# Patient Record
Sex: Male | Born: 1970 | Race: White | Hispanic: No | Marital: Married | Smoking: Current every day smoker
Health system: Southern US, Community
[De-identification: ages and names within clinical notes are randomized; demographics above are authoritative.]

## PROBLEM LIST (undated history)

## (undated) DIAGNOSIS — G4730 Sleep apnea, unspecified: Secondary | ICD-10-CM

## (undated) DIAGNOSIS — F419 Anxiety disorder, unspecified: Secondary | ICD-10-CM

## (undated) DIAGNOSIS — R310 Gross hematuria: Secondary | ICD-10-CM

## (undated) DIAGNOSIS — X58XXXA Exposure to other specified factors, initial encounter: Secondary | ICD-10-CM

## (undated) DIAGNOSIS — S022XXD Fracture of nasal bones, subsequent encounter for fracture with routine healing: Secondary | ICD-10-CM

## (undated) DIAGNOSIS — J342 Deviated nasal septum: Secondary | ICD-10-CM

---

## 1997-11-17 ENCOUNTER — Emergency Department (HOSPITAL_COMMUNITY): Admission: EM | Admit: 1997-11-17 | Discharge: 1997-11-18 | Payer: Self-pay | Admitting: Emergency Medicine

## 2002-12-21 ENCOUNTER — Ambulatory Visit (HOSPITAL_BASED_OUTPATIENT_CLINIC_OR_DEPARTMENT_OTHER): Admission: RE | Admit: 2002-12-21 | Discharge: 2002-12-21 | Payer: Self-pay | Admitting: Otolaryngology

## 2005-05-07 ENCOUNTER — Emergency Department (HOSPITAL_COMMUNITY): Admission: EM | Admit: 2005-05-07 | Discharge: 2005-05-07 | Payer: Self-pay | Admitting: Emergency Medicine

## 2013-11-09 ENCOUNTER — Emergency Department (HOSPITAL_COMMUNITY): Payer: 59

## 2013-11-09 ENCOUNTER — Emergency Department (HOSPITAL_COMMUNITY)
Admission: EM | Admit: 2013-11-09 | Discharge: 2013-11-10 | Disposition: A | Discharge status: Home / Self Care | Payer: 59 | Attending: Emergency Medicine | Admitting: Emergency Medicine

## 2013-11-09 ENCOUNTER — Encounter (HOSPITAL_COMMUNITY): Payer: Self-pay | Admitting: Emergency Medicine

## 2013-11-09 DIAGNOSIS — Z79899 Other long term (current) drug therapy: Secondary | ICD-10-CM | POA: Insufficient documentation

## 2013-11-09 DIAGNOSIS — F17200 Nicotine dependence, unspecified, uncomplicated: Secondary | ICD-10-CM | POA: Insufficient documentation

## 2013-11-09 DIAGNOSIS — R319 Hematuria, unspecified: Secondary | ICD-10-CM

## 2013-11-09 LAB — URINALYSIS, ROUTINE W REFLEX MICROSCOPIC
Glucose, UA: NEGATIVE mg/dL
Ketones, ur: NEGATIVE mg/dL
NITRITE: NEGATIVE
Protein, ur: 100 mg/dL — AB
Specific Gravity, Urine: 1.027 (ref 1.005–1.030)
UROBILINOGEN UA: 1 mg/dL (ref 0.0–1.0)
pH: 6 (ref 5.0–8.0)

## 2013-11-09 LAB — CBC WITH DIFFERENTIAL/PLATELET
BASOS PCT: 0 % (ref 0–1)
Basophils Absolute: 0 10*3/uL (ref 0.0–0.1)
EOS ABS: 0.2 10*3/uL (ref 0.0–0.7)
EOS PCT: 2 % (ref 0–5)
HCT: 39 % (ref 39.0–52.0)
HEMOGLOBIN: 13.2 g/dL (ref 13.0–17.0)
Lymphocytes Relative: 25 % (ref 12–46)
Lymphs Abs: 2.1 10*3/uL (ref 0.7–4.0)
MCH: 30.2 pg (ref 26.0–34.0)
MCHC: 33.8 g/dL (ref 30.0–36.0)
MCV: 89.2 fL (ref 78.0–100.0)
MONOS PCT: 4 % (ref 3–12)
Monocytes Absolute: 0.4 10*3/uL (ref 0.1–1.0)
NEUTROS PCT: 69 % (ref 43–77)
Neutro Abs: 5.9 10*3/uL (ref 1.7–7.7)
PLATELETS: 169 10*3/uL (ref 150–400)
RBC: 4.37 MIL/uL (ref 4.22–5.81)
RDW: 12.5 % (ref 11.5–15.5)
WBC: 8.6 10*3/uL (ref 4.0–10.5)

## 2013-11-09 LAB — URINE MICROSCOPIC-ADD ON
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
## 2013-11-10 ENCOUNTER — Encounter (HOSPITAL_COMMUNITY): Payer: Self-pay | Admitting: Radiology

## 2013-11-10 LAB — COMPREHENSIVE METABOLIC PANEL
ALBUMIN: 4.4 g/dL (ref 3.5–5.2)
ALK PHOS: 65 U/L (ref 39–117)
ALT: 12 U/L (ref 0–53)
AST: 14 U/L (ref 0–37)
BUN: 15 mg/dL (ref 6–23)
CO2: 29 mEq/L (ref 19–32)
Calcium: 9.1 mg/dL (ref 8.4–10.5)
Chloride: 99 mEq/L (ref 96–112)
Creatinine, Ser: 0.78 mg/dL (ref 0.50–1.35)
GFR calc Af Amer: >90 mL/min (ref >90)
GFR calc non Af Amer: >90 mL/min (ref >90)
Glucose, Bld: 119 mg/dL — ABNORMAL HIGH (ref 70–99)
POTASSIUM: 4.7 meq/L (ref 3.7–5.3)
Sodium: 138 mEq/L (ref 137–147)
TOTAL PROTEIN: 7 g/dL (ref 6.0–8.3)
Total Bilirubin: 0.5 mg/dL (ref 0.3–1.2)

## 2013-11-10 LAB — CK: Total CK: 111 U/L (ref 7–232)

## 2013-11-10 MED ORDER — IOHEXOL 300 MG/ML  SOLN
100.0000 mL | Freq: Once | INTRAMUSCULAR | Status: AC | PRN
  Administered 2013-11-10: 100 mL via INTRAVENOUS

## 2013-11-10 MED ORDER — SODIUM CHLORIDE 0.9 % IV BOLUS (SEPSIS)
1000.0000 mL | Freq: Once | INTRAVENOUS | Status: AC
  Administered 2013-11-10: 1000 mL via INTRAVENOUS

## 2013-11-10 NOTE — Discharge Instructions (Signed)
Please follow up with [facility] as soon as possible regarding the large volume of blood in your urine. It is essential that the specialists evaluate you. Please read the information below for more information and for reasons to seek immediate care.  Hematuria, Adult Hematuria is blood in your urine. It can be caused by a bladder infection, kidney infection, prostate infection, kidney stone, or cancer of your urinary tract. Infections can usually be treated with medicine, and a kidney stone usually will pass through your urine. If neither of these is the cause of your hematuria, further workup to find out the reason may be needed. It is very important that you tell your health care provider about any blood you see in your urine, even if the blood stops without treatment or happens without causing pain. Blood in your urine that happens and then stops and then happens again can be a symptom of a very serious condition. Also, pain is not a symptom in the initial stages of many urinary cancers. HOME CARE INSTRUCTIONS   Drink lots of fluid, 3-4 quarts a day. If you have been diagnosed with an infection, cranberry juice is especially recommended, in addition to large amounts of water.  Avoid caffeine, tea, and carbonated beverages, because they tend to irritate the bladder.  Avoid alcohol because it may irritate the prostate.  Only take over-the-counter or prescription medicines for pain, discomfort, or fever as directed by your health care provider.  If you have been diagnosed with a kidney stone, follow your health care provider's instructions regarding straining your urine to catch the stone.  Empty your bladder often. Avoid holding urine for long periods of time.  After a bowel movement, women should cleanse front to back. Use each tissue only once.  Empty your bladder before and after sexual intercourse if you are a male. SEEK MEDICAL CARE IF: You develop back pain, fever, a feeling of  sickness in your stomach (nausea), or vomiting or if your symptoms are not better in 3 days. Return sooner if you are getting worse. SEEK IMMEDIATE MEDICAL CARE IF:   You have a persistent fever, with a temperature of 101.61F (38.8C) or greater.  You develop severe vomiting and are unable to keep the medicine down.  You develop severe back or abdominal pain despite taking your medicines.  You begin passing a large amount of blood or clots in your urine.  You feel extremely weak or faint, or you pass out. MAKE SURE YOU:   Understand these instructions.  Will watch your condition.  Will get help right away if you are not doing well or get worse. Document Released: 05/05/2005 Document Revised: 02/23/2013 Document Reviewed: 01/03/2013 [facility] Patient Information 2015 [place], [place]. This information is not intended to replace advice given to you by your health care provider. Make sure you discuss any questions you have with your health care provider.

## 2013-11-10 NOTE — ED Notes (Signed)
Patient transported to CT 
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
## 2013-11-25 ENCOUNTER — Other Ambulatory Visit: Payer: Self-pay | Admitting: Urology

## 2013-12-01 ENCOUNTER — Encounter (HOSPITAL_BASED_OUTPATIENT_CLINIC_OR_DEPARTMENT_OTHER): Payer: Self-pay | Admitting: *Deleted

## 2013-12-09 ENCOUNTER — Encounter (HOSPITAL_BASED_OUTPATIENT_CLINIC_OR_DEPARTMENT_OTHER): Payer: Self-pay

## 2013-12-09 ENCOUNTER — Encounter (HOSPITAL_BASED_OUTPATIENT_CLINIC_OR_DEPARTMENT_OTHER): Payer: 59 | Admitting: Anesthesiology

## 2013-12-09 ENCOUNTER — Ambulatory Visit (HOSPITAL_BASED_OUTPATIENT_CLINIC_OR_DEPARTMENT_OTHER): Payer: 59 | Admitting: Anesthesiology

## 2013-12-09 ENCOUNTER — Ambulatory Visit (HOSPITAL_BASED_OUTPATIENT_CLINIC_OR_DEPARTMENT_OTHER)
Admission: RE | Admit: 2013-12-09 | Discharge: 2013-12-09 | Disposition: A | Discharge status: Home / Self Care | Payer: 59 | Source: Ambulatory Visit | Attending: Urology | Admitting: Urology

## 2013-12-09 ENCOUNTER — Encounter (HOSPITAL_BASED_OUTPATIENT_CLINIC_OR_DEPARTMENT_OTHER)
Admission: RE | Disposition: A | Discharge status: Home / Self Care | Payer: Self-pay | Source: Ambulatory Visit | Attending: Urology

## 2013-12-09 DIAGNOSIS — Z538 Procedure and treatment not carried out for other reasons: Secondary | ICD-10-CM | POA: Diagnosis not present

## 2013-12-09 DIAGNOSIS — R310 Gross hematuria: Secondary | ICD-10-CM | POA: Insufficient documentation

## 2013-12-09 HISTORY — DX: Sleep apnea, unspecified: G47.30

## 2013-12-09 HISTORY — DX: Exposure to other specified factors, initial encounter: X58.XXXA

## 2013-12-09 HISTORY — DX: Gross hematuria: R31.0

## 2013-12-09 HISTORY — DX: Anxiety disorder, unspecified: F41.9

## 2013-12-09 SURGERY — CYSTOURETEROSCOPY, WITH RETROGRADE PYELOGRAM AND STENT INSERTION
Anesthesia: General | Laterality: Left

## 2013-12-09 MED ORDER — MIDAZOLAM HCL 2 MG/2ML IJ SOLN
INTRAMUSCULAR | Status: AC
  Filled 2013-12-09: qty 2

## 2013-12-09 MED ORDER — LACTATED RINGERS IV SOLN
INTRAVENOUS | Status: DC
  Filled 2013-12-09: qty 1000

## 2013-12-09 MED ORDER — CEFAZOLIN SODIUM-DEXTROSE 2-3 GM-% IV SOLR
2.0000 g | INTRAVENOUS | Status: DC
  Filled 2013-12-09: qty 50

## 2013-12-09 MED ORDER — FENTANYL CITRATE 0.05 MG/ML IJ SOLN
INTRAMUSCULAR | Status: AC
  Filled 2013-12-09: qty 4

## 2013-12-09 SURGICAL SUPPLY — 33 items
ADAPTER CATH URET PLST 4-6FR (CATHETERS) IMPLANT
BAG DRAIN URO-CYSTO SKYTR STRL (DRAIN) IMPLANT
BASKET LASER NITINOL 1.9FR (BASKET) IMPLANT
BASKET STNLS GEMINI 4WIRE 3FR (BASKET) IMPLANT
BASKET ZERO TIP NITINOL 2.4FR (BASKET) IMPLANT
CANISTER SUCT LVC 12 LTR MEDI- (MISCELLANEOUS) IMPLANT
CATH INTERMIT  6FR 70CM (CATHETERS) IMPLANT
CATH URET 5FR 28IN CONE TIP (BALLOONS)
CATH URET 5FR 28IN OPEN ENDED (CATHETERS) IMPLANT
CATH URET 5FR 70CM CONE TIP (BALLOONS) IMPLANT
CLOTH BEACON ORANGE TIMEOUT ST (SAFETY) IMPLANT
DRAPE CAMERA CLOSED 9X96 (DRAPES) IMPLANT
ELECT REM PT RETURN 9FT ADLT (ELECTROSURGICAL)
ELECTRODE REM PT RTRN 9FT ADLT (ELECTROSURGICAL) IMPLANT
FIBER LASER FLEXIVA 200 (UROLOGICAL SUPPLIES) IMPLANT
FIBER LASER FLEXIVA 365 (UROLOGICAL SUPPLIES) IMPLANT
GLOVE BIO SURGEON STRL SZ7.5 (GLOVE) IMPLANT
GOWN PREVENTION PLUS LG XLONG (DISPOSABLE) IMPLANT
GOWN STRL REIN XL XLG (GOWN DISPOSABLE) IMPLANT
GUIDEWIRE 0.038 PTFE COATED (WIRE) IMPLANT
GUIDEWIRE ANG ZIPWIRE 038X150 (WIRE) IMPLANT
GUIDEWIRE STR DUAL SENSOR (WIRE) IMPLANT
IV NS IRRIG 3000ML ARTHROMATIC (IV SOLUTION) IMPLANT
KIT BALLIN UROMAX 15FX10 (LABEL) IMPLANT
KIT BALLN UROMAX 15FX4 (MISCELLANEOUS) IMPLANT
KIT BALLN UROMAX 26 75X4 (MISCELLANEOUS)
PACK CYSTOSCOPY (CUSTOM PROCEDURE TRAY) IMPLANT
SET HIGH PRES BAL DIL (LABEL)
SHEATH ACCESS URETERAL 38CM (SHEATH) IMPLANT
SHEATH ACCESS URETERAL 54CM (SHEATH) IMPLANT
SHEATH URET ACCESS 12FR/35CM (UROLOGICAL SUPPLIES) IMPLANT
SHEATH URET ACCESS 12FR/55CM (UROLOGICAL SUPPLIES) IMPLANT
SYRINGE IRR TOOMEY STRL 70CC (SYRINGE) IMPLANT

## 2013-12-09 NOTE — H&P (Signed)
  Patient needs diagnostic cystoscopy, retrograde pyelograms and possible ureteroscopy. This will be rescheduled when the [facility] available.

## 2013-12-09 NOTE — Progress Notes (Signed)
Case cancelled by Dr. Mena GoesEskridge due to his desire to use a specific type of scope that is being trialed and the reps are not here today.

## 2013-12-09 NOTE — Progress Notes (Signed)
The patient reports this morning his gross hematuria is clearing. Urine now light pink. He continues to be adamant that he wants to get the most information at of one procedure and wants to limit his risk of a staged procedure. I share his concerns and fortunately we are trialing Wolf equipment and have needlepoint semirigid ureteroscope as well as a small caliber high-resolution digital ureteroscope available. I believe this equipment would greatly enhance our diagnostic ability as well as our chances of gaining upper tract access and limiting staged procedure. I told the patient on a recent 70 inspection of the bladder the current scopes are dark and our diagnostic ability is severely compromised. I told him earlier in the week we had planned to continue surveillance, but with recurrence and gross hematuria I do recommend endoscopic evaluation. I told him [facility] would [facility] and line them up for his procedure. I would be willing to post-pone the procedure to later today and rearrange my schedule. I then scrubbed in my first case. The patient was informed [facility] not available today. The patient was understandably frustrated and left before I could discuss with him proceeding today with the old equipment. He told the nurses I was "wishy washy". Unfortunately, he has a significant problem without a clear diagnosis right now and his case takes some careful consideration and planning.

## 2013-12-09 NOTE — Anesthesia Preprocedure Evaluation (Deleted)
Anesthesia Evaluation  Patient identified by MRN, date of birth, ID band Patient awake    Reviewed: Allergy & Precautions, H&P , NPO status , Patient's Chart, lab work & pertinent test results  Airway Mallampati: II TM Distance: >3 FB Neck ROM: Full    Dental no notable dental hx.    Pulmonary Current Smoker,  breath sounds clear to auscultation  Pulmonary exam normal       Cardiovascular negative cardio ROS  Rhythm:Regular Rate:Normal     Neuro/Psych negative neurological ROS  negative psych ROS   GI/Hepatic negative GI ROS, (+)     substance abuse  , Methadone use   Endo/Other  negative endocrine ROS  Renal/GU negative Renal ROS  negative genitourinary   Musculoskeletal negative musculoskeletal ROS (+)   Abdominal   Peds negative pediatric ROS (+)  Hematology negative hematology ROS (+)   Anesthesia Other Findings   Reproductive/Obstetrics negative OB ROS                           Anesthesia Physical Anesthesia Plan  ASA: II  Anesthesia Plan: General   Post-op Pain Management:    Induction: Intravenous  Airway Management Planned: LMA  Additional Equipment:   Intra-op Plan:   Post-operative Plan: Extubation in OR  Informed Consent: I have reviewed the patients History and Physical, chart, labs and discussed the procedure including the risks, benefits and alternatives for the proposed anesthesia with the patient or authorized representative who has indicated his/her understanding and acceptance.   Dental advisory given  Plan Discussed with: CRNA and Surgeon  Anesthesia Plan Comments:         Anesthesia Quick Evaluation

## 2014-06-01 ENCOUNTER — Encounter (HOSPITAL_BASED_OUTPATIENT_CLINIC_OR_DEPARTMENT_OTHER): Payer: Self-pay | Admitting: Urology

## 2015-06-20 ENCOUNTER — Inpatient Hospital Stay (HOSPITAL_COMMUNITY)
Admission: EM | Admit: 2015-06-20 | Discharge: 2015-06-24 | DRG: 880 | Disposition: A | Discharge status: Home / Self Care | Payer: Self-pay | Attending: Internal Medicine | Admitting: Internal Medicine

## 2015-06-20 ENCOUNTER — Encounter (HOSPITAL_COMMUNITY): Payer: Self-pay | Admitting: Emergency Medicine

## 2015-06-20 DIAGNOSIS — D649 Anemia, unspecified: Secondary | ICD-10-CM | POA: Diagnosis present

## 2015-06-20 DIAGNOSIS — R7989 Other specified abnormal findings of blood chemistry: Secondary | ICD-10-CM | POA: Diagnosis present

## 2015-06-20 DIAGNOSIS — E46 Unspecified protein-calorie malnutrition: Secondary | ICD-10-CM | POA: Diagnosis present

## 2015-06-20 DIAGNOSIS — R45851 Suicidal ideations: Principal | ICD-10-CM | POA: Diagnosis present

## 2015-06-20 DIAGNOSIS — F1120 Opioid dependence, uncomplicated: Secondary | ICD-10-CM | POA: Diagnosis present

## 2015-06-20 DIAGNOSIS — F419 Anxiety disorder, unspecified: Secondary | ICD-10-CM | POA: Diagnosis present

## 2015-06-20 DIAGNOSIS — Z79899 Other long term (current) drug therapy: Secondary | ICD-10-CM

## 2015-06-20 DIAGNOSIS — R634 Abnormal weight loss: Secondary | ICD-10-CM

## 2015-06-20 DIAGNOSIS — F909 Attention-deficit hyperactivity disorder, unspecified type: Secondary | ICD-10-CM | POA: Diagnosis present

## 2015-06-20 DIAGNOSIS — F1510 Other stimulant abuse, uncomplicated: Secondary | ICD-10-CM | POA: Diagnosis present

## 2015-06-20 DIAGNOSIS — F1410 Cocaine abuse, uncomplicated: Secondary | ICD-10-CM | POA: Diagnosis present

## 2015-06-20 DIAGNOSIS — R3129 Other microscopic hematuria: Secondary | ICD-10-CM | POA: Diagnosis present

## 2015-06-20 DIAGNOSIS — G4730 Sleep apnea, unspecified: Secondary | ICD-10-CM | POA: Diagnosis present

## 2015-06-20 DIAGNOSIS — F322 Major depressive disorder, single episode, severe without psychotic features: Secondary | ICD-10-CM | POA: Diagnosis present

## 2015-06-20 DIAGNOSIS — F17210 Nicotine dependence, cigarettes, uncomplicated: Secondary | ICD-10-CM | POA: Diagnosis present

## 2015-06-20 DIAGNOSIS — T50901A Poisoning by unspecified drugs, medicaments and biological substances, accidental (unintentional), initial encounter: Secondary | ICD-10-CM | POA: Diagnosis present

## 2015-06-20 DIAGNOSIS — F1010 Alcohol abuse, uncomplicated: Secondary | ICD-10-CM | POA: Diagnosis present

## 2015-06-20 DIAGNOSIS — R319 Hematuria, unspecified: Secondary | ICD-10-CM

## 2015-06-20 DIAGNOSIS — R64 Cachexia: Secondary | ICD-10-CM | POA: Diagnosis present

## 2015-06-20 DIAGNOSIS — D509 Iron deficiency anemia, unspecified: Secondary | ICD-10-CM | POA: Diagnosis present

## 2015-06-20 DIAGNOSIS — T50911A Poisoning by multiple unspecified drugs, medicaments and biological substances, accidental (unintentional), initial encounter: Secondary | ICD-10-CM | POA: Diagnosis present

## 2015-06-20 DIAGNOSIS — F1110 Opioid abuse, uncomplicated: Secondary | ICD-10-CM

## 2015-06-20 DIAGNOSIS — F1123 Opioid dependence with withdrawal: Secondary | ICD-10-CM | POA: Diagnosis present

## 2015-06-20 LAB — CBC WITH DIFFERENTIAL/PLATELET
BASOS ABS: 0 10*3/uL (ref 0.0–0.1)
Basophils Relative: 0 %
Eosinophils Absolute: 0.2 10*3/uL (ref 0.0–0.7)
Eosinophils Relative: 3 %
HEMATOCRIT: 26.2 % — AB (ref 39.0–52.0)
Hemoglobin: 7.3 g/dL — ABNORMAL LOW (ref 13.0–17.0)
LYMPHS PCT: 28 %
Lymphs Abs: 1.6 10*3/uL (ref 0.7–4.0)
MCH: 20.6 pg — ABNORMAL LOW (ref 26.0–34.0)
MCHC: 27.9 g/dL — ABNORMAL LOW (ref 30.0–36.0)
MCV: 73.8 fL — ABNORMAL LOW (ref 78.0–100.0)
MONOS PCT: 7 %
Monocytes Absolute: 0.4 10*3/uL (ref 0.1–1.0)
Neutro Abs: 3.5 10*3/uL (ref 1.7–7.7)
Neutrophils Relative %: 62 %
Platelets: 203 10*3/uL (ref 150–400)
RBC: 3.55 MIL/uL — AB (ref 4.22–5.81)
RDW: 16 % — ABNORMAL HIGH (ref 11.5–15.5)
WBC: 5.7 10*3/uL (ref 4.0–10.5)

## 2015-06-20 LAB — COMPREHENSIVE METABOLIC PANEL
ALT: 17 U/L (ref 17–63)
AST: 20 U/L (ref 15–41)
Albumin: 3.9 g/dL (ref 3.5–5.0)
Alkaline Phosphatase: 66 U/L (ref 38–126)
Anion gap: 8 (ref 5–15)
BUN: 18 mg/dL (ref 6–20)
CHLORIDE: 100 mmol/L — AB (ref 101–111)
CO2: 28 mmol/L (ref 22–32)
Calcium: 8.5 mg/dL — ABNORMAL LOW (ref 8.9–10.3)
Creatinine, Ser: 0.73 mg/dL (ref 0.61–1.24)
GFR calc non Af Amer: >60 mL/min (ref >60)
Glucose, Bld: 92 mg/dL (ref 65–99)
Potassium: 3.9 mmol/L (ref 3.5–5.1)
SODIUM: 136 mmol/L (ref 135–145)
Total Bilirubin: 0.4 mg/dL (ref 0.3–1.2)
Total Protein: 6.5 g/dL (ref 6.5–8.1)

## 2015-06-20 LAB — ACETAMINOPHEN LEVEL

## 2015-06-20 LAB — ETHANOL

## 2015-06-20 LAB — SALICYLATE LEVEL: Salicylate Lvl: <4 mg/dL (ref 2.8–30.0)

## 2015-06-20 MED ORDER — CLONAZEPAM 1 MG PO TABS
2.0000 mg | ORAL_TABLET | Freq: Three times a day (TID) | ORAL | Status: DC | PRN
  Administered 2015-06-21 (×2): 2 mg via ORAL
  Filled 2015-06-20 (×2): qty 2

## 2015-06-20 NOTE — BH Assessment (Signed)
Consulted with Donell Sievert, PA-C who recommends observation overnight and evaluation but psychiatry in the morning to uphold or rescind the IVC. Informed EDP of disposition.   Davina Poke, LCSW Therapeutic Triage Specialist [facility] 06/20/2015 9:07 PM
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
## 2015-06-20 NOTE — BH Assessment (Signed)
Contacted patients brother Reece Leader, who is the petitioner, at [phone] to obtain collateral information on what led to him petitioning the patient to an IVC. There was no answer and no identifying information on the voicemail. Left voicemail requesting he return call to [facility] at [phone].   Davina Poke, LCSW Therapeutic Triage Specialist [facility] 06/20/2015 8:49 PM

## 2015-06-20 NOTE — ED Notes (Signed)
Pt reminded of need for urine sample.  

## 2015-06-20 NOTE — BHH Counselor (Signed)
Requested UDS from patients nurse.  Davina Poke, LCSW Therapeutic Triage Specialist [facility] 06/20/2015 10:07 PM

## 2015-06-20 NOTE — ED Notes (Signed)
Bed: [facility] Expected date: 06/20/15 Expected time:  Means of arrival:  Comments: Hold for [employer]

## 2015-06-20 NOTE — ED Notes (Addendum)
Pt here from home with [employer]. He is being IVC'ed for suicidal thoughts. Pt's brother is here from [place] and IVCed him tonight for reporting SI. Pt is alert and coopertive. Pt denies any suicidal thoughts. He states he has been under stress related to his recent divorce, but he thinks that "suicide is the greatest sin one can commit".
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
## 2015-06-21 ENCOUNTER — Inpatient Hospital Stay (HOSPITAL_COMMUNITY): Payer: Self-pay

## 2015-06-21 ENCOUNTER — Encounter (HOSPITAL_COMMUNITY): Payer: Self-pay | Admitting: Radiology

## 2015-06-21 DIAGNOSIS — D500 Iron deficiency anemia secondary to blood loss (chronic): Secondary | ICD-10-CM

## 2015-06-21 DIAGNOSIS — F1120 Opioid dependence, uncomplicated: Secondary | ICD-10-CM | POA: Diagnosis present

## 2015-06-21 DIAGNOSIS — D509 Iron deficiency anemia, unspecified: Secondary | ICD-10-CM | POA: Diagnosis present

## 2015-06-21 DIAGNOSIS — D649 Anemia, unspecified: Secondary | ICD-10-CM | POA: Diagnosis present

## 2015-06-21 DIAGNOSIS — T50911A Poisoning by multiple unspecified drugs, medicaments and biological substances, accidental (unintentional), initial encounter: Secondary | ICD-10-CM | POA: Diagnosis present

## 2015-06-21 DIAGNOSIS — F1110 Opioid abuse, uncomplicated: Secondary | ICD-10-CM | POA: Insufficient documentation

## 2015-06-21 DIAGNOSIS — F322 Major depressive disorder, single episode, severe without psychotic features: Secondary | ICD-10-CM | POA: Diagnosis present

## 2015-06-21 LAB — URINALYSIS, ROUTINE W REFLEX MICROSCOPIC
Bilirubin Urine: NEGATIVE
GLUCOSE, UA: NEGATIVE mg/dL
Ketones, ur: NEGATIVE mg/dL
LEUKOCYTES UA: NEGATIVE
Nitrite: NEGATIVE
PH: 8 (ref 5.0–8.0)
Protein, ur: NEGATIVE mg/dL
SPECIFIC GRAVITY, URINE: 1.009 (ref 1.005–1.030)

## 2015-06-21 LAB — VITAMIN B12: VITAMIN B 12: 249 pg/mL (ref 180–914)

## 2015-06-21 LAB — CBC WITH DIFFERENTIAL/PLATELET
Basophils Absolute: 0 10*3/uL (ref 0.0–0.1)
Basophils Relative: 0 %
EOS PCT: 3 %
Eosinophils Absolute: 0.1 10*3/uL (ref 0.0–0.7)
HEMATOCRIT: 25.6 % — AB (ref 39.0–52.0)
HEMOGLOBIN: 7.1 g/dL — AB (ref 13.0–17.0)
LYMPHS ABS: 0.7 10*3/uL (ref 0.7–4.0)
LYMPHS PCT: 18 %
MCH: 19.8 pg — ABNORMAL LOW (ref 26.0–34.0)
MCHC: 27.7 g/dL — ABNORMAL LOW (ref 30.0–36.0)
MCV: 71.5 fL — AB (ref 78.0–100.0)
MONOS PCT: 7 %
Monocytes Absolute: 0.3 10*3/uL (ref 0.1–1.0)
Neutro Abs: 2.7 10*3/uL (ref 1.7–7.7)
Neutrophils Relative %: 72 %
Platelets: 220 10*3/uL (ref 150–400)
RBC: 3.58 MIL/uL — AB (ref 4.22–5.81)
RDW: 16 % — AB (ref 11.5–15.5)
WBC: 3.8 10*3/uL — AB (ref 4.0–10.5)

## 2015-06-21 LAB — IRON AND TIBC
IRON: 10 ug/dL — AB (ref 45–182)
Iron: 14 ug/dL — ABNORMAL LOW (ref 45–182)
SATURATION RATIOS: 2 % — AB (ref 17.9–39.5)
SATURATION RATIOS: 3 % — AB (ref 17.9–39.5)
TIBC: 410 ug/dL (ref 250–450)
TIBC: 409 ug/dL (ref 250–450)
UIBC: 400 ug/dL
UIBC: 395 ug/dL

## 2015-06-21 LAB — FERRITIN
FERRITIN: 3 ng/mL — AB (ref 24–336)
Ferritin: 3 ng/mL — ABNORMAL LOW (ref 24–336)

## 2015-06-21 LAB — POC OCCULT BLOOD, ED: Fecal Occult Bld: NEGATIVE

## 2015-06-21 LAB — URINE MICROSCOPIC-ADD ON
BACTERIA UA: NONE SEEN
WBC, UA: NONE SEEN WBC/hpf (ref 0–5)

## 2015-06-21 LAB — RAPID URINE DRUG SCREEN, HOSP PERFORMED
AMPHETAMINES: POSITIVE — AB
Barbiturates: NOT DETECTED
Benzodiazepines: NOT DETECTED
COCAINE: POSITIVE — AB
Opiates: POSITIVE — AB
TETRAHYDROCANNABINOL: NOT DETECTED

## 2015-06-21 LAB — TSH: TSH: 0.294 u[IU]/mL — ABNORMAL LOW (ref 0.350–4.500)

## 2015-06-21 LAB — MAGNESIUM: Magnesium: 1.9 mg/dL (ref 1.7–2.4)

## 2015-06-21 LAB — PREPARE RBC (CROSSMATCH)

## 2015-06-21 LAB — TROPONIN I

## 2015-06-21 LAB — ABO/RH: ABO/RH(D): A POS

## 2015-06-21 MED ORDER — HYDROXYZINE HCL 25 MG PO TABS
25.0000 mg | ORAL_TABLET | Freq: Four times a day (QID) | ORAL | Status: DC | PRN
  Administered 2015-06-21 – 2015-06-24 (×4): 25 mg via ORAL
  Filled 2015-06-21 (×4): qty 1

## 2015-06-21 MED ORDER — ONDANSETRON 4 MG PO TBDP: 4.0000 mg | ORAL_TABLET | Freq: Four times a day (QID) | ORAL | Status: DC | PRN

## 2015-06-21 MED ORDER — CLONIDINE HCL 0.1 MG PO TABS
0.1000 mg | ORAL_TABLET | Freq: Every day | ORAL | Status: DC
  Filled 2015-06-21: qty 1

## 2015-06-21 MED ORDER — ONDANSETRON HCL 4 MG PO TABS: 4.0000 mg | ORAL_TABLET | Freq: Four times a day (QID) | ORAL | Status: DC | PRN

## 2015-06-21 MED ORDER — LEVALBUTEROL HCL 0.63 MG/3ML IN NEBU: 0.6300 mg | INHALATION_SOLUTION | Freq: Four times a day (QID) | RESPIRATORY_TRACT | Status: DC | PRN

## 2015-06-21 MED ORDER — FOLIC ACID 1 MG PO TABS
1.0000 mg | ORAL_TABLET | Freq: Every day | ORAL | Status: DC
  Administered 2015-06-21 – 2015-06-24 (×3): 1 mg via ORAL
  Filled 2015-06-21 (×4): qty 1

## 2015-06-21 MED ORDER — CLONIDINE HCL 0.1 MG PO TABS
0.1000 mg | ORAL_TABLET | Freq: Four times a day (QID) | ORAL | Status: AC
  Administered 2015-06-21 – 2015-06-22 (×7): 0.1 mg via ORAL
  Filled 2015-06-21 (×7): qty 1

## 2015-06-21 MED ORDER — LORAZEPAM 1 MG PO TABS
1.0000 mg | ORAL_TABLET | Freq: Four times a day (QID) | ORAL | Status: DC | PRN
  Administered 2015-06-22: 1 mg via ORAL
  Filled 2015-06-21: qty 1

## 2015-06-21 MED ORDER — AMPHETAMINE-DEXTROAMPHETAMINE 10 MG PO TABS
10.0000 mg | ORAL_TABLET | Freq: Two times a day (BID) | ORAL | Status: DC
  Administered 2015-06-22 – 2015-06-24 (×5): 10 mg via ORAL
  Filled 2015-06-21 (×5): qty 1

## 2015-06-21 MED ORDER — ACETAMINOPHEN 325 MG PO TABS: 650.0000 mg | ORAL_TABLET | Freq: Four times a day (QID) | ORAL | Status: DC | PRN

## 2015-06-21 MED ORDER — SODIUM CHLORIDE 0.9 % IV SOLN
80.0000 mg | Freq: Once | INTRAVENOUS | Status: AC
  Administered 2015-06-21: 80 mg via INTRAVENOUS
  Filled 2015-06-21: qty 80

## 2015-06-21 MED ORDER — VITAMIN B-1 100 MG PO TABS
100.0000 mg | ORAL_TABLET | Freq: Every day | ORAL | Status: DC
  Administered 2015-06-21 – 2015-06-24 (×3): 100 mg via ORAL
  Filled 2015-06-21 (×4): qty 1

## 2015-06-21 MED ORDER — THIAMINE HCL 100 MG/ML IJ SOLN
Freq: Once | INTRAVENOUS | Status: AC
  Administered 2015-06-21: 14:00:00 via INTRAVENOUS
  Filled 2015-06-21: qty 1000

## 2015-06-21 MED ORDER — DICYCLOMINE HCL 20 MG PO TABS
20.0000 mg | ORAL_TABLET | Freq: Four times a day (QID) | ORAL | Status: DC | PRN
  Filled 2015-06-21: qty 1

## 2015-06-21 MED ORDER — CLONIDINE HCL 0.1 MG PO TABS
0.1000 mg | ORAL_TABLET | ORAL | Status: DC
  Administered 2015-06-23 – 2015-06-24 (×3): 0.1 mg via ORAL
  Filled 2015-06-21 (×4): qty 1

## 2015-06-21 MED ORDER — IOHEXOL 300 MG/ML  SOLN
100.0000 mL | Freq: Once | INTRAMUSCULAR | Status: AC | PRN
  Administered 2015-06-21: 100 mL via INTRAVENOUS

## 2015-06-21 MED ORDER — IOHEXOL 300 MG/ML  SOLN
50.0000 mL | Freq: Once | INTRAMUSCULAR | Status: DC | PRN
  Administered 2015-06-21: 50 mL via ORAL
  Filled 2015-06-21: qty 50

## 2015-06-21 MED ORDER — MIRTAZAPINE 7.5 MG PO TABS
7.5000 mg | ORAL_TABLET | Freq: Every day | ORAL | Status: DC
Start: 2015-06-21 — End: 2015-06-24
  Administered 2015-06-21 – 2015-06-23 (×3): 7.5 mg via ORAL
  Filled 2015-06-21 (×5): qty 1

## 2015-06-21 MED ORDER — METHOCARBAMOL 500 MG PO TABS
500.0000 mg | ORAL_TABLET | Freq: Three times a day (TID) | ORAL | Status: DC | PRN
  Administered 2015-06-21 – 2015-06-23 (×4): 500 mg via ORAL
  Filled 2015-06-21 (×4): qty 1

## 2015-06-21 MED ORDER — PANTOPRAZOLE SODIUM 40 MG IV SOLR: 40.0000 mg | Freq: Two times a day (BID) | INTRAVENOUS | Status: DC

## 2015-06-21 MED ORDER — CLONAZEPAM 1 MG PO TABS
1.0000 mg | ORAL_TABLET | Freq: Once | ORAL | Status: AC
  Administered 2015-06-21: 1 mg via ORAL
  Filled 2015-06-21: qty 1

## 2015-06-21 MED ORDER — ENOXAPARIN SODIUM 40 MG/0.4ML ~~LOC~~ SOLN
40.0000 mg | SUBCUTANEOUS | Status: DC
  Filled 2015-06-21 (×2): qty 0.4

## 2015-06-21 MED ORDER — THIAMINE HCL 100 MG/ML IJ SOLN
100.0000 mg | Freq: Every day | INTRAMUSCULAR | Status: DC
  Filled 2015-06-21 (×3): qty 1

## 2015-06-21 MED ORDER — NAPROXEN 500 MG PO TABS: 500.0000 mg | ORAL_TABLET | Freq: Two times a day (BID) | ORAL | Status: DC | PRN

## 2015-06-21 MED ORDER — LOPERAMIDE HCL 2 MG PO CAPS: 2.0000 mg | ORAL_CAPSULE | ORAL | Status: DC | PRN

## 2015-06-21 MED ORDER — LORAZEPAM 2 MG/ML IJ SOLN
1.0000 mg | Freq: Four times a day (QID) | INTRAMUSCULAR | Status: DC | PRN
  Administered 2015-06-21 – 2015-06-24 (×9): 1 mg via INTRAVENOUS
  Filled 2015-06-21 (×10): qty 1

## 2015-06-21 MED ORDER — PANTOPRAZOLE SODIUM 40 MG IV SOLR
8.0000 mg/h | INTRAVENOUS | Status: DC
  Administered 2015-06-21 – 2015-06-22 (×3): 8 mg/h via INTRAVENOUS
  Filled 2015-06-21 (×4): qty 80

## 2015-06-21 MED ORDER — SODIUM CHLORIDE 0.9% FLUSH
3.0000 mL | Freq: Two times a day (BID) | INTRAVENOUS | Status: DC
  Administered 2015-06-21 – 2015-06-24 (×5): 3 mL via INTRAVENOUS

## 2015-06-21 MED ORDER — SODIUM CHLORIDE 0.9 % IV SOLN
Freq: Once | INTRAVENOUS | Status: AC
  Administered 2015-06-21: 14:00:00 via INTRAVENOUS

## 2015-06-21 MED ORDER — NICOTINE 14 MG/24HR TD PT24
14.0000 mg | MEDICATED_PATCH | Freq: Every day | TRANSDERMAL | Status: DC
  Administered 2015-06-22 – 2015-06-24 (×3): 14 mg via TRANSDERMAL
  Filled 2015-06-21 (×3): qty 1

## 2015-06-21 MED ORDER — GABAPENTIN 100 MG PO CAPS
200.0000 mg | ORAL_CAPSULE | Freq: Three times a day (TID) | ORAL | Status: DC
  Administered 2015-06-21 – 2015-06-24 (×10): 200 mg via ORAL
  Filled 2015-06-21 (×12): qty 2

## 2015-06-21 MED ORDER — ADULT MULTIVITAMIN W/MINERALS CH
1.0000 | ORAL_TABLET | Freq: Every day | ORAL | Status: DC
  Administered 2015-06-21 – 2015-06-24 (×3): 1 via ORAL
  Filled 2015-06-21 (×4): qty 1

## 2015-06-21 MED ORDER — LORAZEPAM 1 MG PO TABS
1.0000 mg | ORAL_TABLET | Freq: Three times a day (TID) | ORAL | Status: DC | PRN
Start: 2015-06-21 — End: 2015-06-24

## 2015-06-21 MED ORDER — ACETAMINOPHEN 650 MG RE SUPP: 650.0000 mg | Freq: Four times a day (QID) | RECTAL | Status: DC | PRN

## 2015-06-21 MED ORDER — ONDANSETRON HCL 4 MG/2ML IJ SOLN: 4.0000 mg | Freq: Four times a day (QID) | INTRAMUSCULAR | Status: DC | PRN
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
## 2015-06-21 NOTE — ED Notes (Signed)
Spoke with EDP about patients Hemoglobin.  Notified ER Charge about need to transfer.

## 2015-06-21 NOTE — ED Notes (Signed)
No channels available for blood transfusion. Portable called. AD Jen notified.

## 2015-06-21 NOTE — Progress Notes (Addendum)
Pt informed CM while she visited that he wanted to file a grievance for being here "because of my brother" "now he is not answering his phone"  "He lives in [place]" He states he spoke with [facility] NP/MD about this earlier.    CM spoke with pt who confirms uninsured [employer] resident with no pcp.  CM discussed and provided written information for uninsured accepting pcps, discussed the importance of pcp vs EDP services for f/u care, www.needymeds.org, www.goodrx.com, discounted pharmacies and other [employer] such as [facility] , [employer], affordable care act, financial assistance, uninsured dental services, [place] med assist, DSS and  health department  Reviewed resources for [employer] uninsured accepting pcps like [facility], family medicine at [facility], [facility], [facility], local urgent care centers, [facility], [facility] family practice, general medical clinics, family services of the [place], [facility] urgent care plus others, medication resources, [facility] out patient pharmacies and housing Pt voiced understanding and appreciation of resources provided   Provided [facility] contact information Pt agreed to a referral Cm completed referral Pt to be contact by [facility] clinical liason   Entered in d/c instructions  Please use the resources provided to you in emergency room by case manager to assist with doctor for follow up Call on 06/25/2015 A referral for you has been sent to [facility] if you have not received a call in 3 days you may contact them Call Scherry Ran at [phone] Tuesday-Friday [email or web address], As needed These [place] uninsured resources provide possible primary care providers, resources for discounted medications, housing, dental resources, affordable care act information, plus other resources for [facility]

## 2015-06-21 NOTE — ED Notes (Signed)
Patient transported to CT 

## 2015-06-21 NOTE — ED Notes (Signed)
Patient denies SI, HI, AVH. Reports frustration, anxiety related to being IVC and hospitalized. Upset that his "adopted brother is able to do this". Appears drowsy.  Patient requested PRN Klonopin.  Encouragement offered. Oriented to the unit.   Q 15 safety checks in place.

## 2015-06-21 NOTE — Progress Notes (Signed)
Pt transferred from ED to room 1507. AO x 4. Pt on suicide and seizure precautions. Pt orientated to the room. Safety sitter at pt bedside. Pt belongings locked up. No questions or concerns from the pt at this moment. Sejal Cofield W Malynn Lucy, RN

## 2015-06-21 NOTE — ED Notes (Signed)
Bed: WA21 Expected date:  Expected time:  Means of arrival:  Comments: Hold for psych ED

## 2015-06-22 DIAGNOSIS — R7989 Other specified abnormal findings of blood chemistry: Secondary | ICD-10-CM | POA: Diagnosis present

## 2015-06-22 DIAGNOSIS — R3129 Other microscopic hematuria: Secondary | ICD-10-CM | POA: Diagnosis present

## 2015-06-22 LAB — COMPREHENSIVE METABOLIC PANEL
ALT: 14 U/L — AB (ref 17–63)
AST: 16 U/L (ref 15–41)
Albumin: 3.4 g/dL — ABNORMAL LOW (ref 3.5–5.0)
Alkaline Phosphatase: 54 U/L (ref 38–126)
Anion gap: 5 (ref 5–15)
BUN: 9 mg/dL (ref 6–20)
CHLORIDE: 111 mmol/L (ref 101–111)
CO2: 26 mmol/L (ref 22–32)
CREATININE: 0.63 mg/dL (ref 0.61–1.24)
Calcium: 8.4 mg/dL — ABNORMAL LOW (ref 8.9–10.3)
Glucose, Bld: 119 mg/dL — ABNORMAL HIGH (ref 65–99)
POTASSIUM: 3.7 mmol/L (ref 3.5–5.1)
SODIUM: 142 mmol/L (ref 135–145)
Total Bilirubin: 0.5 mg/dL (ref 0.3–1.2)
Total Protein: 5.6 g/dL — ABNORMAL LOW (ref 6.5–8.1)

## 2015-06-22 LAB — CBC
HEMATOCRIT: 29.4 % — AB (ref 39.0–52.0)
HEMOGLOBIN: 8.7 g/dL — AB (ref 13.0–17.0)
MCH: 22 pg — AB (ref 26.0–34.0)
MCHC: 29.6 g/dL — AB (ref 30.0–36.0)
MCV: 74.4 fL — AB (ref 78.0–100.0)
PLATELETS: 203 10*3/uL (ref 150–400)
RBC: 3.95 MIL/uL — AB (ref 4.22–5.81)
RDW: 16.7 % — AB (ref 11.5–15.5)
WBC: 6.3 10*3/uL (ref 4.0–10.5)

## 2015-06-22 LAB — TYPE AND SCREEN
ABO/RH(D): A POS
ANTIBODY SCREEN: NEGATIVE
UNIT DIVISION: 0
Unit division: 0

## 2015-06-22 LAB — RETICULOCYTES
RBC.: 3.95 MIL/uL — AB (ref 4.22–5.81)
RETIC COUNT ABSOLUTE: 39.5 10*3/uL (ref 19.0–186.0)
Retic Ct Pct: 1 % (ref 0.4–3.1)

## 2015-06-22 LAB — HEPATITIS PANEL, ACUTE
HCV Ab: <0.1 s/co ratio (ref 0.0–0.9)
HEP B S AG: NEGATIVE
Hep A IgM: NEGATIVE
Hep B C IgM: NEGATIVE

## 2015-06-22 LAB — FOLATE: Folate: 12.2 ng/mL (ref >5.9)

## 2015-06-22 LAB — HIV ANTIBODY (ROUTINE TESTING W REFLEX): HIV SCREEN 4TH GENERATION: NONREACTIVE

## 2015-06-22 MED ORDER — LORAZEPAM 2 MG/ML IJ SOLN
1.0000 mg | Freq: Once | INTRAMUSCULAR | Status: AC
  Administered 2015-06-22: 1 mg via INTRAVENOUS
  Filled 2015-06-22: qty 1

## 2015-06-22 MED ORDER — MORPHINE SULFATE (PF) 2 MG/ML IV SOLN
2.0000 mg | INTRAVENOUS | Status: DC | PRN
  Administered 2015-06-22 – 2015-06-24 (×8): 2 mg via INTRAVENOUS
  Filled 2015-06-22 (×8): qty 1

## 2015-06-22 MED ORDER — PANTOPRAZOLE SODIUM 40 MG PO TBEC
40.0000 mg | DELAYED_RELEASE_TABLET | Freq: Two times a day (BID) | ORAL | Status: DC
  Administered 2015-06-22 – 2015-06-24 (×4): 40 mg via ORAL
  Filled 2015-06-22 (×5): qty 1

## 2015-06-22 NOTE — Progress Notes (Addendum)
Alexander Gamble ZOX:[id number] DOB: 1971/04/22 DOA: 06/20/2015 PCP: No PCP Per Patient  Assessment by Dr. Susie Cassette at time of admission on 06/21/15 45 year old male with a history of suicidal ideation, under IVC, brought in to [facility] and placed on a commitment by the patient's brother , patient brought in last night by [employer]. Patient admitted to [facility] ED for suicidal ideation, multiple stressors including recent divorce. Patient is concerned that the wife are take away his daughter from him. Patient states that he has a long standing history of depression, long-standing history of substance abuse, he was sober for 10 years on methadone and Suboxone that was managed by a pain clinic. However things fell apart after his recent separation from his wife. Patient has a history of multiple drug abuse including cocaine, crystal meth, alcohol, cigarettes, heroine. UDS is positive for amphetamines, opiates, cocaine patient incidentally is also found to have a hemoglobin of 7.1 with an MCV of 71.5. According to him he does not use IV heroine, he only snorts it . Patient states that he has not had any visible bleeding, denies any coffee-ground emesis, denies any hematochezia, denies any recent weight loss. He is hemodynamically stable. He is being admitted to medicine for medical clearance. Summary&Daily Progress Notes since admission 06/22/15: I have seen and examined Alexander Gamble at bedside, reviewed his chart and consulted psychiatry. Patient has microscopic hematuria which she says emanates from the left kidney post trauma that happened 2 years ago. He says he always has blood in the urine because of that. We'll therefore change Protonix to oral, discontinue Lovenox, start SCDs and follow psychiatry recommendations. Patient responded to transfusion of 2 units PRBC. He says that he does not have suicidal ideation and that his brother mentioned suicide to get him involuntarily committed. He is worried about going  into withdrawal. He seems medically stable and he could discharge once he is seen by psychiatry. He should follow with urology outpatient. Problem List Plan  Principal Problem:   Opioid dependence with physiological dependence (HCC) Active Problems:   Severe major depression, single episode, without psychotic features (HCC)   Anemia   Multiple drug overdose   Microcytic anemia   Low TSH level   Hematuria, microscopic   D/c Lovenox  Scds  Change protonix to oral  Add morphine to avoid narcotic withdrawal.  Code Status: Full Code Family Communication: None at bedside Disposition Plan: Dependent on psychiatry recommendations Consultants:  Psychiatry Procedures:  Prbc transfusion Antibiotics:  None  HPI/Subjective: Complains about pain and requests his Klonopin.  Objective: Filed Vitals:   06/22/15 1200 06/22/15 1357  BP: 113/84 118/71  Pulse: 88 88  Temp:  98.7 F (37.1 C)  Resp:  18    Intake/Output Summary (Last 24 hours) at 06/22/15 1651 Last data filed at 06/22/15 0900  Gross per 24 hour  Intake   2460 ml  Output      0 ml  Net   2460 ml   Filed Weights   06/20/15 1957  Weight: 63.957 kg (141 lb)    Exam:   General:  Comfortable at rest.  Cardiovascular: S1-S2 normal. No murmurs. Pulse regular.  Respiratory: Good air entry bilaterally. No rhonchi or rales.  Abdomen: Soft and nontender. Normal bowel sounds. No organomegaly.  Musculoskeletal: No pedal edema   Neurological: Intact  Data Reviewed: Basic Metabolic Panel:  Recent Labs Lab 06/20/15 2024 06/21/15 1331 06/22/15 0554  NA 136  --  142  K 3.9  --  3.7  CL 100*  --  111  CO2 28  --  26  GLUCOSE 92  --  119*  BUN 18  --  9  CREATININE 0.73  --  0.63  CALCIUM 8.5*  --  8.4*  MG  --  1.9  --    Liver Function Tests:  Recent Labs Lab 06/20/15 2024 06/22/15 0554  AST 20 16  ALT 17 14*  ALKPHOS 66 54  BILITOT 0.4 0.5  PROT 6.5 5.6*  ALBUMIN 3.9 3.4*   No results for  input(s): LIPASE, AMYLASE in the last 168 hours. No results for input(s): AMMONIA in the last 168 hours. CBC:  Recent Labs Lab 06/20/15 2024 06/21/15 1055 06/22/15 0554  WBC 5.7 3.8* 6.3  NEUTROABS 3.5 2.7  --   HGB 7.3* 7.1* 8.7*  HCT 26.2* 25.6* 29.4*  MCV 73.8* 71.5* 74.4*  PLT 203 220 203   Cardiac Enzymes:  Recent Labs Lab 06/21/15 1331  TROPONINI <0.03   BNP (last 3 results) No results for input(s): BNP in the last 8760 hours.  ProBNP (last 3 results) No results for input(s): PROBNP in the last 8760 hours.  CBG: No results for input(s): GLUCAP in the last 168 hours.  No results found for this or any previous visit (from the past 240 hour(s)).   Studies: Ct Abdomen Pelvis W Contrast  06/21/2015  CLINICAL DATA:  Weight loss and low hemoglobin EXAM: CT ABDOMEN AND PELVIS WITH CONTRAST TECHNIQUE: Multidetector CT imaging of the abdomen and pelvis was performed using the standard protocol following bolus administration of intravenous contrast. CONTRAST:  OMNIPAQUE IOHEXOL 300 MG/ML  SOLN COMPARISON:  11/30/2013 FINDINGS: Lung bases are free of acute infiltrate or sizable effusion. The liver, spleen, adrenal glands and pancreas are within normal limits. The gallbladder is partially distended and within normal limits. The kidneys are well visualized and demonstrate a normal enhancement pattern. Normal excretion of contrast is noted bilaterally. No obstructive changes are seen. The appendix is not well visualized. The terminal ileum is fluid filled. No obstructive changes are seen. The bladder is well distended. No free pelvic fluid is noted. No bony abnormality is noted. IMPRESSION: No acute abnormality is seen. Electronically Signed   By: Alcide Clever M.D.   On: 06/21/2015 15:11    Scheduled Meds: . amphetamine-dextroamphetamine  10 mg Oral BID WC  . cloNIDine  0.1 mg Oral QID   Followed by  . [START ON 06/23/2015] cloNIDine  0.1 mg Oral BH-qamhs   Followed by  .  [START ON 06/25/2015] cloNIDine  0.1 mg Oral QAC breakfast  . folic acid  1 mg Oral Daily  . gabapentin  200 mg Oral TID  . mirtazapine  7.5 mg Oral QHS  . multivitamin with minerals  1 tablet Oral Daily  . nicotine  14 mg Transdermal Daily  . pantoprazole  40 mg Oral BID  . sodium chloride flush  3 mL Intravenous Q12H  . thiamine  100 mg Oral Daily   Or  . thiamine  100 mg Intravenous Daily   Continuous Infusions:     Time spent: 25 minutes    Destynie Toomey  [facility] Pager [phone]. If 7PM-7AM, please contact night-coverage at [email or web address], password [facility] 06/22/2015, 4:51 PM  LOS: 1 day

## 2015-06-23 LAB — BASIC METABOLIC PANEL
ANION GAP: 5 (ref 5–15)
BUN: 14 mg/dL (ref 6–20)
CO2: 24 mmol/L (ref 22–32)
Calcium: 8.1 mg/dL — ABNORMAL LOW (ref 8.9–10.3)
Chloride: 110 mmol/L (ref 101–111)
Creatinine, Ser: 0.61 mg/dL (ref 0.61–1.24)
GLUCOSE: 114 mg/dL — AB (ref 65–99)
POTASSIUM: 3.6 mmol/L (ref 3.5–5.1)
Sodium: 139 mmol/L (ref 135–145)

## 2015-06-23 LAB — CBC
HCT: 31.4 % — ABNORMAL LOW (ref 39.0–52.0)
Hemoglobin: 9.4 g/dL — ABNORMAL LOW (ref 13.0–17.0)
MCH: 22.3 pg — ABNORMAL LOW (ref 26.0–34.0)
MCHC: 29.9 g/dL — ABNORMAL LOW (ref 30.0–36.0)
MCV: 74.6 fL — AB (ref 78.0–100.0)
PLATELETS: 228 10*3/uL (ref 150–400)
RBC: 4.21 MIL/uL — AB (ref 4.22–5.81)
RDW: 17.2 % — ABNORMAL HIGH (ref 11.5–15.5)
WBC: 7.6 10*3/uL (ref 4.0–10.5)

## 2015-06-23 LAB — T4, FREE: FREE T4: 0.87 ng/dL (ref 0.61–1.12)

## 2015-06-23 MED ORDER — CLONAZEPAM 1 MG PO TABS
2.0000 mg | ORAL_TABLET | Freq: Once | ORAL | Status: AC
  Administered 2015-06-23: 2 mg via ORAL
  Filled 2015-06-23: qty 2

## 2015-06-23 NOTE — Progress Notes (Signed)
Alexander Gamble VQQ:[id number] DOB: [birth year] DOA: 06/20/2015 PCP: No PCP Per Patient  Assessment by Dr. Susie Cassette at time of admission on 06/21/15 45 year old male with a history of suicidal ideation, under IVC, brought in to [facility] ER and placed on a commitment by the patient's brother , patient brought in last night by [employer]. Patient admitted to [facility] ED for suicidal ideation, multiple stressors including recent divorce. Patient is concerned that the wife are take away his daughter from him. Patient states that he has a long standing history of depression, long-standing history of substance abuse, he was sober for 10 years on methadone and Suboxone that was managed by a pain clinic. However things fell apart after his recent separation from his wife. Patient has a history of multiple drug abuse including cocaine, crystal meth, alcohol, cigarettes, heroine. UDS is positive for amphetamines, opiates, cocaine patient incidentally is also found to have a hemoglobin of 7.1 with an MCV of 71.5. According to him he does not use IV heroine, he only snorts it . Patient states that he has not had any visible bleeding, denies any coffee-ground emesis, denies any hematochezia, denies any recent weight loss. He is hemodynamically stable. He is being admitted to medicine for medical clearance. Summary&Daily Progress Notes since admission 06/22/15: I have seen and examined Mr. Alexander Gamble at bedside, reviewed his chart and consulted psychiatry. Patient has microscopic hematuria which she says emanates from the left kidney post trauma that happened 2 years ago. He says he always has blood in the urine because of that. We'll therefore change Protonix to oral, discontinue Lovenox, start SCDs and follow psychiatry recommendations. Patient responded to transfusion of 2 units PRBC. He says that he does not have suicidal ideation and that his brother mentioned suicide to get him involuntarily committed. He is worried about going  into withdrawal. He seems medically stable and he could discharge once he is seen by psychiatry. He should follow with urology outpatient. 06/23/15: Appreciate CSW. Await psych eval. Medically stable to transfer to psych or discharge home, Hb stable. Will follow psych recommendation.. Problem List Plan  Principal Problem:   Opioid dependence with physiological dependence (HCC) Active Problems:   Severe major depression, single episode, without psychotic features (HCC)   Anemia   Multiple drug overdose   Microcytic anemia   Low TSH level   Hematuria, microscopic   Continue current management  Follow psych recommendation  Code Status: Full Code Family Communication: None at bedside Disposition Plan: Dependent on psychiatry recommendations Consultants:  Psychiatry Procedures:  Prbc transfusion Antibiotics:  None  HPI/Subjective: Feels great, wants to go home. Says he is no longer suicidal  Objective: Filed Vitals:   06/23/15 0515 06/23/15 1515  BP: 118/69 112/67  Pulse: 84 87  Temp: 98.1 F (36.7 C) 98.6 F (37 C)  Resp: 15 16    Intake/Output Summary (Last 24 hours) at 06/23/15 1835 Last data filed at 06/23/15 1300  Gross per 24 hour  Intake   1200 ml  Output      0 ml  Net   1200 ml   Filed Weights   06/20/15 1957 06/23/15 1100  Weight: 63.957 kg (141 lb) 63.957 kg (141 lb)    Exam:   General:  Comfortable at rest.  Cardiovascular: S1-S2 normal. No murmurs. Pulse regular.  Respiratory: Good air entry bilaterally. No rhonchi or rales.  Abdomen: Soft and nontender. Normal bowel sounds. No organomegaly.  Musculoskeletal: No pedal edema   Neurological: Intact  Data  Reviewed: Basic Metabolic Panel:  Recent Labs Lab 06/20/15 2024 06/21/15 1331 06/22/15 0554 06/23/15 0613  NA 136  --  142 139  K 3.9  --  3.7 3.6  CL 100*  --  111 110  CO2 28  --  26 24  GLUCOSE 92  --  119* 114*  BUN 18  --  9 14  CREATININE 0.73  --  0.63 0.61  CALCIUM 8.5*   --  8.4* 8.1*  MG  --  1.9  --   --    Liver Function Tests:  Recent Labs Lab 06/20/15 2024 06/22/15 0554  AST 20 16  ALT 17 14*  ALKPHOS 66 54  BILITOT 0.4 0.5  PROT 6.5 5.6*  ALBUMIN 3.9 3.4*   No results for input(s): LIPASE, AMYLASE in the last 168 hours. No results for input(s): AMMONIA in the last 168 hours. CBC:  Recent Labs Lab 06/20/15 2024 06/21/15 1055 06/22/15 0554 06/23/15 0613  WBC 5.7 3.8* 6.3 7.6  NEUTROABS 3.5 2.7  --   --   HGB 7.3* 7.1* 8.7* 9.4*  HCT 26.2* 25.6* 29.4* 31.4*  MCV 73.8* 71.5* 74.4* 74.6*  PLT 203 220 203 228   Cardiac Enzymes:  Recent Labs Lab 06/21/15 1331  TROPONINI <0.03   BNP (last 3 results) No results for input(s): BNP in the last 8760 hours.  ProBNP (last 3 results) No results for input(s): PROBNP in the last 8760 hours.  CBG: No results for input(s): GLUCAP in the last 168 hours.  No results found for this or any previous visit (from the past 240 hour(s)).   Studies: No results found.  Scheduled Meds: . amphetamine-dextroamphetamine  10 mg Oral BID WC  . cloNIDine  0.1 mg Oral BH-qamhs   Followed by  . [START ON 06/25/2015] cloNIDine  0.1 mg Oral QAC breakfast  . folic acid  1 mg Oral Daily  . gabapentin  200 mg Oral TID  . mirtazapine  7.5 mg Oral QHS  . multivitamin with minerals  1 tablet Oral Daily  . nicotine  14 mg Transdermal Daily  . pantoprazole  40 mg Oral BID  . sodium chloride flush  3 mL Intravenous Q12H  . thiamine  100 mg Oral Daily   Or  . thiamine  100 mg Intravenous Daily   Continuous Infusions:      Myeesha Shane  [facility] Pager [phone]. If 7PM-7AM, please contact night-coverage at [email or web address], password [facility] 06/23/2015, 6:35 PM  LOS: 2 days

## 2015-06-23 NOTE — Clinical Social Work Psych Assess (Signed)
Clinical Social Work Psych Service Line Assessment  Clinical Social Worker:  Alexander Gamble, Alexander Gamble, Alexander Gamble:  06/23/2015, 1:29 PM Referred By:  Physician Date Referred:  06/23/15 Reason for Referral:  Behavioral Health Issues   Presenting Symptoms/Problems  Presenting Symptoms/Problems(in person's/family's own words):  Pt under IVC from brother due to voicing SI   Abuse/Neglect/Trauma History  Abuse/Neglect/Trauma History:  Denies History Abuse/Neglect/Trauma History Comments (indicate dates):  N/A   Psychiatric History  Psychiatric History:  Outpatient Treatment Psychiatric Medication:  Adderall and Klonopin   Current Mental Health Hospitalizations/Previous Mental Health History:  Pt reports he has been diagnosed with ADHD. Pt stated he might have a mood disorder related to substance use but denied any formal diagnosis of a mood disorder.   Current Provider:  Dr. Kaur Place and Date:  [place], [name]  Current Medications:   Scheduled Meds: . amphetamine-dextroamphetamine  10 mg Oral BID WC  . cloNIDine  0.1 mg Oral BH-qamhs   Followed by  . [START ON 06/25/2015] cloNIDine  0.1 mg Oral QAC breakfast  . folic acid  1 mg Oral Daily  . gabapentin  200 mg Oral TID  . mirtazapine  7.5 mg Oral QHS  . multivitamin with minerals  1 tablet Oral Daily  . nicotine  14 mg Transdermal Daily  . pantoprazole  40 mg Oral BID  . sodium chloride flush  3 mL Intravenous Q12H  . thiamine  100 mg Oral Daily   Or  . thiamine  100 mg Intravenous Daily   Continuous Infusions:  PRN Meds:.acetaminophen **OR** acetaminophen, dicyclomine, hydrOXYzine, iohexol, levalbuterol, LORazepam **OR** LORazepam, LORazepam, methocarbamol, morphine injection, ondansetron **OR** ondansetron (ZOFRAN) IV, ondansetron    Previous Inpatient Admission/Date/Reason:  None reported   Emotional Health/Current Symptoms  Suicide/Self Harm: None Reported Suicide Attempt in Past (date/description):  Pt denies  any current or previous SI or HI. Pt reports he would never harm himself because it is against his religion and he would not want his dtr to know that he killed himself.  Other Harmful Behavior (ex. homicidal ideation) (describe):  Pt relapsed and has been using drugs for the past 3 months.   Psychotic/Dissociative Symptoms  Psychotic/Dissociative Symptoms: None Reported Other Psychotic/Dissociative Symptoms:  N/A   Attention/Behavioral Symptoms  Attention/Behavioral Symptoms: Restless Other Attention/Behavioral Symptoms:  Pt reports he is experiencing withdraws.   Cognitive Impairment  Cognitive Impairment:  Within Normal Limits Other Cognitive Impairment:  Pt is alert and oriented.   Mood and Adjustment  Mood and Adjustment:  Mood Congruent   Stress, Anxiety, Trauma, Any Recent Loss/Stressor  Stress, Anxiety, Trauma, Any Recent Loss/Stressor: Relationship Anxiety (frequency):  N/A  Phobia (specify):  N/A  Compulsive Behavior (specify):  N/A  Obsessive Behavior (specify):  N/A  Other Stress, Anxiety, Trauma, Any Recent Loss/Stressor:  Pt and wife are separated and custody issues with 45 year old dtr.   Substance Abuse/Use  Substance Abuse/Use: Current Substance Use SBIRT Completed (please refer for detailed history): Yes Self-reported Substance Use (last use and frequency):  Pt reports he smokes crystal meth and snorts heroin.   Urinary Drug Screen Completed: Yes (Opiates, Cocaine, Amphetamines) Alcohol Level:  <5   Environment/Housing/Living Arrangement  Environmental/Housing/Living Arrangement: Stable Housing Who is in the Home:  Alone  Emergency Contact:  Pt reports no one to contact at this time.   Financial  Financial: IPRS (Pt believes he might be covered under wife's insurance)   Patient's Strengths and Goals  Patient's Strengths and Goals (patient's own words):  Pt   attends outpatient medication management and had a period of 10 years  sobriety.    Clinical Social Worker's Interpretive Summary  Clinical Social Workers Interpretive Summary:    CSW received referral due to pt being admitted under IVC for SI. CSW reviewed chart and pt was served on 2/1 and it is valid for 7 days (expiring on 2/8). Per chart review, brother in [place] IVCed pt after he voiced that he was going to cut his wrists.   CSW met with pt at bedside. CSW introduced myself and explained role. Pt alert and oriented and agreeable to assessment.  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 Pt reports he moved to [place] when he was 45 years old and has family in [place] and [place]. Pt reports strained relationships with most of family but reports some positive relationships with family in [place]. Pt stated that he was talking with this brother on the phone in [place] and that brother was encouraging him to go residential treatment for SA. Pt stated he is not interested in residential rehab because he would lose clients and would be unable to pay his bills. Pt believes that brother was upset with him for refusing inpt treatment and IVCed in an attempt to get him placed for SA treatment.   Pt reports he started using LSD, marijuana, and alcohol when he was 45 years old. Pt started using heroin when he was about 16 years old. When pt was in his 20s he met his wife and decided he wanted to get sober for her. [facility]. Pt stated that he was seeing Dr. Kaur for treatment of ADHD and switched from Methadone to Suboxone about 1 year ago and was being prescribed from Dr. Joe in her clinic. Pt reports he abused his Adderall and Klonopin about once weekly and that he was kicked out of Suboxone clinic about 3 weeks ago for having a positive drug screen. Pt denies any mood disorders and denies any SI or HI. Pt reports he was interested in getting sober again and had spoken to  Dr. Kaur who reports if he has a negative drug screen for 2 weeks then he could be reconsidered for Suboxone treatment.  Pt identifies triggers to using such as recent separation from wife, custody concerns with seeing dtr, cravings for drugs due to no Suboxone treatment, and having friends that abuse drugs. Pt is not interested in residential treatment but reports he would consider individual treatment again. CSW provided substance abuse resources and explained that pt would need to be evaluated by psych MD as well.  Pt was engaged during assessment with appropriate affect. Pt was restless at times and complained of withdrawal symptoms. Pt is able to contract for safety and reports no SI or plans to harm himself.    Disposition  Disposition: Recommend Psych CSW Continuing To Support While In The Hospital   Alexander Gerber, Alexander Gamble Weekend Coverage                                

## 2015-06-24 DIAGNOSIS — F1120 Opioid dependence, uncomplicated: Secondary | ICD-10-CM

## 2015-06-24 DIAGNOSIS — F322 Major depressive disorder, single episode, severe without psychotic features: Secondary | ICD-10-CM

## 2015-06-24 MED ORDER — PANTOPRAZOLE SODIUM 40 MG PO TBEC: 40.0000 mg | DELAYED_RELEASE_TABLET | Freq: Every day | ORAL | Status: AC

## 2015-06-24 MED ORDER — FERROUS SULFATE 325 (65 FE) MG PO TABS: 325.0000 mg | ORAL_TABLET | Freq: Every day | ORAL | Status: AC
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
## 2015-06-24 NOTE — Discharge Summary (Signed)
Alexander Gamble, is a 45 y.o. male  DOB [birth year]  MRN [id number].  Admission date:  06/20/2015  Admitting Physician  Richarda Overlie, MD  Discharge Date:  06/24/2015   Primary MD  No PCP Per Patient  Recommendations for primary care physician for things to follow:  Continue further investigations for microcytic anemia.   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 He was incidentally found to have symptomatic microcytic anemia with hemoglobin 7.1 g/dl and this is likely coming from microscopic hematuria which patient attributes to left kidney injury that happened 2 years ago. Patient is supposed to follow with urology for further management of the left kidney. He received 2 units PRBC with an appropriate response in hemoglobin to 9.4 g/dl. He was seen by psychiatry who felt that he was not longer suicidal and could be followed by psychiatry outpatient which will be arranged by the clinical social worker prior to patient discharge today. He will need to work with his primary care provider for  long-term management of microcytic anemia. Meanwhile, he will discharge on ferrous sulfate and ppi. Please refer to the daily progress notes below for details of patient's hospital stay; Hospital Course  Assessment by Dr. Susie Cassette at time of admission on 06/21/15 45 year old male with a history of suicidal ideation, under IVC, brought in to [facility] ER and placed on a commitment by the patient's brother , patient brought in last night by [employer]. Patient admitted to [facility] ED for suicidal ideation, multiple stressors including recent divorce. Patient is concerned that the wife are take away his daughter from him. Patient states that he has a long standing history of depression, long-standing history of substance abuse, he was sober for 10 years on methadone and Suboxone that was managed by a pain clinic. However things fell apart after his recent separation from his wife. Patient has a history of multiple drug abuse including cocaine, crystal meth, alcohol, cigarettes, heroine. UDS is positive for amphetamines, opiates, cocaine patient incidentally is also found to have a hemoglobin of 7.1 with an MCV of 71.5. According to him he does not use IV heroine, he only snorts it . Patient states that he has not had any visible bleeding, denies any coffee-ground emesis, denies any hematochezia, denies any recent weight loss. He is hemodynamically stable. He is being admitted to medicine for  medical clearance. Summary&Daily Progress Notes since admission 06/22/15: I have seen and examined Alexander Gamble at bedside, reviewed his chart and consulted psychiatry. Patient has microscopic hematuria which she says emanates from the left kidney post trauma that happened 2 years ago. He says he always has blood in the urine because of that. We'll therefore change Protonix to oral, discontinue Lovenox, start SCDs and follow psychiatry recommendations. Patient responded to transfusion of 2 units PRBC. He says that he does not have  suicidal ideation and that his brother mentioned suicide to get him involuntarily committed. He is worried about going into withdrawal. He seems medically stable and he could discharge once he is seen by psychiatry. He should follow with urology outpatient. 06/23/15: Appreciate CSW. Await psych eval. Medically stable to transfer to psych or discharge home, Hb stable. Will follow psych recommendation. 06/24/15: Seen by psychiatry. Appreciate help. Will DC home to follow with psychiatry/urology.   Discharge Condition Stable.  Consults obtained  Psychiatry  Follow UP Psychiatry/Your PCP/Urology  Follow-up Information    Follow up with Please use the resources provided to you in emergency room by case manager to assist with doctor for follow up . Call on 06/25/2015.   Why:  A referral for you has been sent to [facility] if you have not received a call in 3 days you may contact them Call Scherry Ran at [phone] Tuesday-Friday [email or web address], As needed   Contact information:   These [place] uninsured resources provide possible primary care providers, resources for discounted medications, housing, dental resources, affordable care act information, plus other resources for [facility]          Discharge Instructions  and  Discharge Medications  Discharge Instructions    Diet - low sodium heart healthy    Complete by:  As directed      Discharge instructions    Complete by:  As directed   Follow psychiatry/Urology outpatient     Increase activity slowly    Complete by:  As directed             Medication List    TAKE these medications        acetaminophen 500 MG tablet  Commonly known as:  TYLENOL  Take 500 mg by mouth every 6 (six) hours as needed for mild pain.     amphetamine-dextroamphetamine 30 MG 24 hr capsule  Commonly known as:  ADDERALL XR  Take 30 mg by mouth every morning.     amphetamine-dextroamphetamine 20 MG  tablet  Commonly known as:  ADDERALL  Take 10 mg by mouth 2 (two) times daily.     clonazePAM 2 MG tablet  Commonly known as:  KLONOPIN  Take 2 mg by mouth 3 (three) times daily as needed for anxiety.     cloNIDine 0.1 MG tablet  Commonly known as:  CATAPRES  Take 1 tablet by mouth daily as needed (withdrawal).     ferrous sulfate 325 (65 FE) MG tablet  Take 1 tablet (325 mg total) by mouth daily with breakfast.     pantoprazole 40 MG tablet  Commonly known as:  PROTONIX  Take 1 tablet (40 mg total) by mouth daily.        Diet and Activity recommendation: See Discharge Instructions above  Major procedures and Radiology Reports - PLEASE review detailed and final reports for all details, in brief -    Ct Abdomen Pelvis W Contrast  06/21/2015  CLINICAL DATA:  Weight loss and low hemoglobin EXAM: CT ABDOMEN AND PELVIS WITH CONTRAST TECHNIQUE: Multidetector CT imaging of the abdomen and pelvis was performed using the standard protocol following bolus administration of intravenous contrast. CONTRAST:  OMNIPAQUE IOHEXOL 300 MG/ML  SOLN COMPARISON:  11/30/2013 FINDINGS: Lung bases are free of acute infiltrate or sizable effusion. The liver, spleen, adrenal glands and pancreas are within normal limits. The gallbladder is partially distended and within normal limits. The kidneys are well visualized and demonstrate a normal enhancement pattern. Normal excretion of contrast is noted bilaterally. No obstructive changes are seen. The appendix is not well visualized. The terminal ileum is fluid filled. No obstructive changes are seen. The bladder is well distended. No free pelvic fluid is noted. No bony abnormality is noted. IMPRESSION: No acute abnormality is seen. Electronically Signed   By: Alcide Clever M.D.   On: 06/21/2015 15:11    Micro Results   No results found for this or any previous visit (from the past 240 hour(s)).     Today   Subjective:   Alexander Gamble today has no  headache,no chest abdominal pain,no new weakness tingling or numbness, feels much better wants to go home today.   Objective:   Blood pressure 110/68, pulse 67, temperature 97.8 F (36.6 C), temperature source Oral, resp. rate 16, height  (1.803 m), weight 63.957 kg (141 lb), SpO2 100 %.   Intake/Output Summary (Last 24 hours) at 06/24/15 1227 Last data filed at 06/24/15 0700  Gross per 24 hour  Intake    480 ml  Output      0 ml  Net    480 ml    Exam Awake Alert, Oriented x 3, No new F.N deficits, Normal affect [place].AT,PERRAL Supple Neck,No JVD, No cervical lymphadenopathy appriciated.  Symmetrical Chest wall movement, Good air movement bilaterally, CTAB RRR,No Gallops,Rubs or new Murmurs, No Parasternal Heave +ve B.Sounds, Abd Soft, Non tender, No organomegaly appriciated, No rebound -guarding or rigidity. No Cyanosis, Clubbing or edema, No new Rash or bruise  Data Review   CBC w Diff: Lab Results  Component Value Date   WBC 7.6 06/23/2015   HGB 9.4* 06/23/2015   HCT 31.4* 06/23/2015   PLT 228 06/23/2015   LYMPHOPCT 18 06/21/2015   MONOPCT 7 06/21/2015   EOSPCT 3 06/21/2015   BASOPCT 0 06/21/2015    CMP: Lab Results  Component Value Date   NA 139 06/23/2015   K 3.6 06/23/2015   CL 110 06/23/2015   CO2 24 06/23/2015   BUN 14 06/23/2015   CREATININE 0.61 06/23/2015   PROT 5.6* 06/22/2015   ALBUMIN 3.4* 06/22/2015   BILITOT 0.5 06/22/2015   ALKPHOS 54 06/22/2015   AST 16 06/22/2015   ALT 14* 06/22/2015  .   Total Time in preparing paper work, data evaluation and todays exam - 25 minutes  Alexander Gamble M.D on 06/24/2015 at 12:27 PM  [facility] Office  [phone]

## 2017-02-05 IMAGING — CT CT ABD-PELV W/ CM
2 of 5 series · 16 of 46 positions shown, 18 images · IV contrast (omnipaque)
Comparison: 11/30/2013

CLINICAL DATA: Weight loss and low hemoglobin

EXAM:
CT ABDOMEN AND PELVIS WITH CONTRAST
TECHNIQUE: Multidetector CT imaging of the abdomen and pelvis was performed
using the standard protocol following bolus administration of
intravenous contrast.
CONTRAST:  100mL OMNIPAQUE IOHEXOL 300 MG/ML  SOLN

[Series 2: abd/pel with · axial · 0.73mm/px · z∈[-562,-232]mm · 13 of 76 slices shown, 15 images]
[im 5/76  soft-tissue]
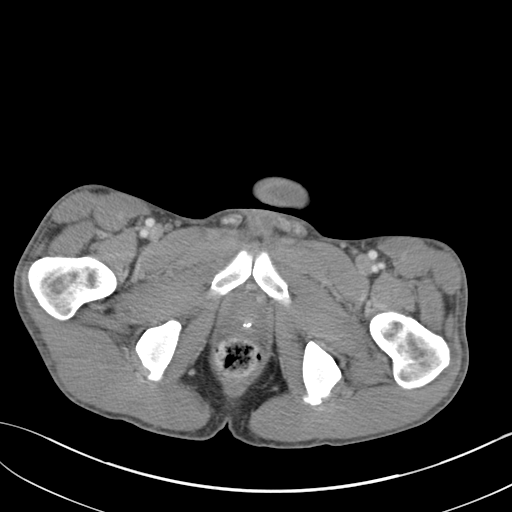
[im 5/76  bone]
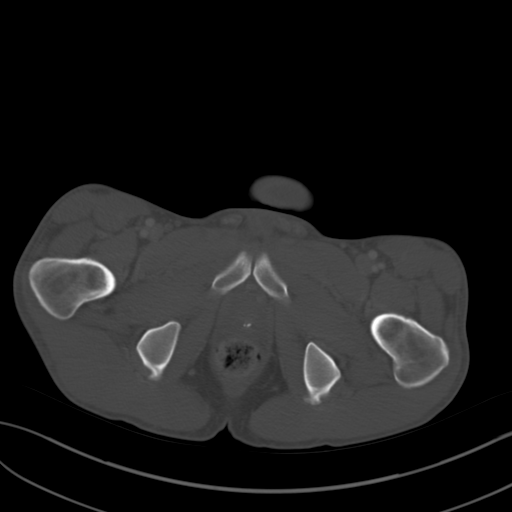
[im 9/76  soft-tissue]
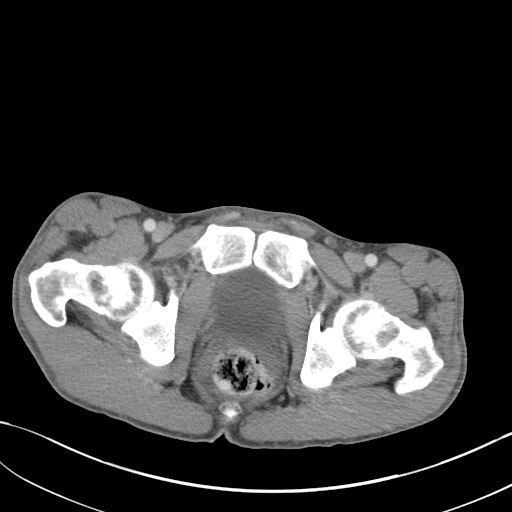
[im 17/76  soft-tissue]
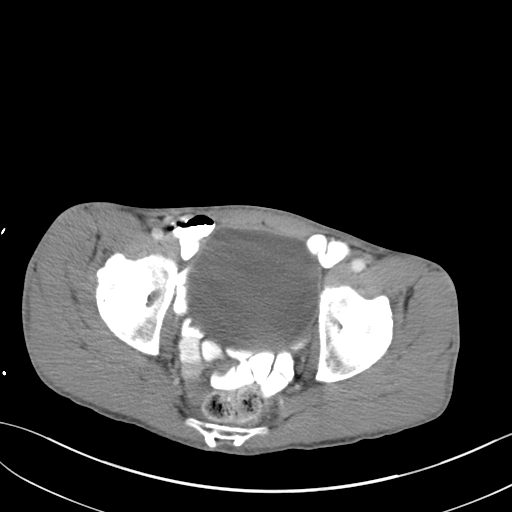
[im 21/76  soft-tissue]
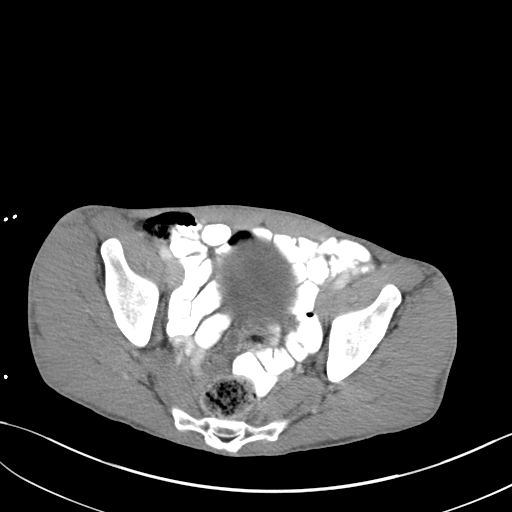
[im 26/76  soft-tissue]
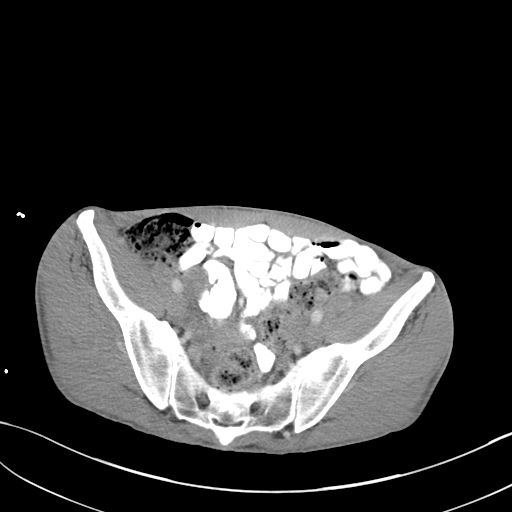
[im 34/76  soft-tissue]
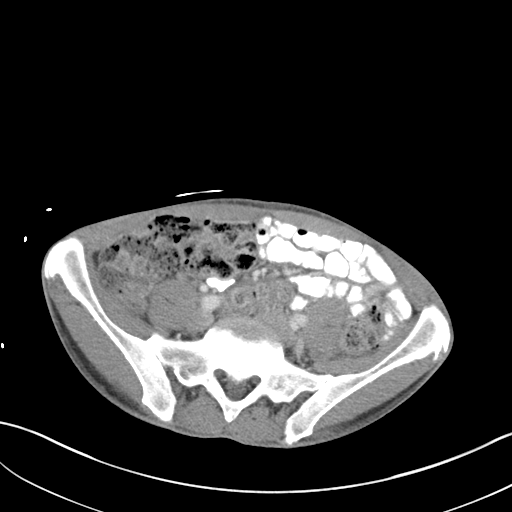
[im 38/76  soft-tissue]
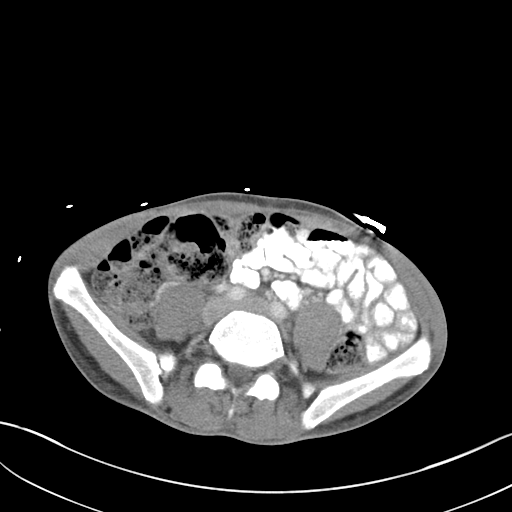
[im 42/76  soft-tissue]
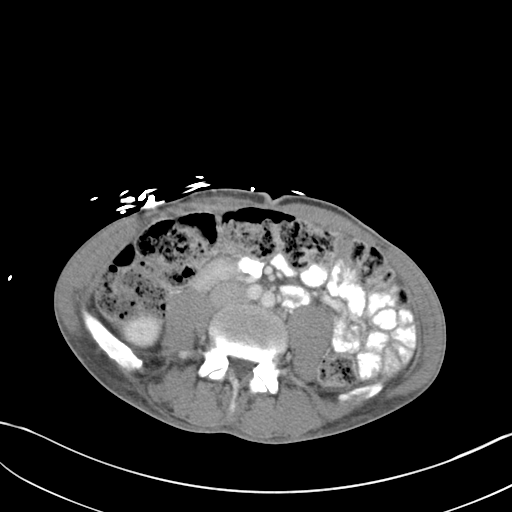
[im 51/76  soft-tissue]
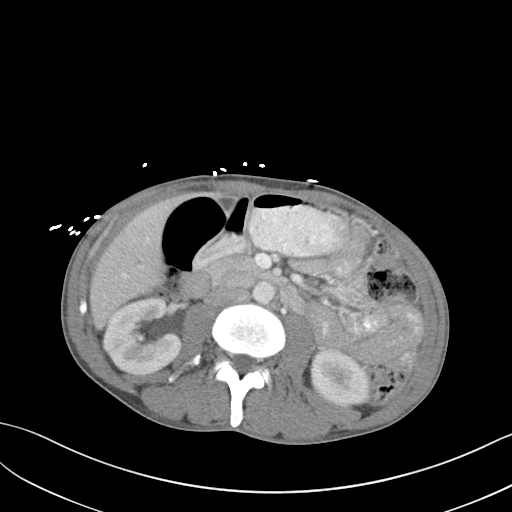
[im 51/76  bone]
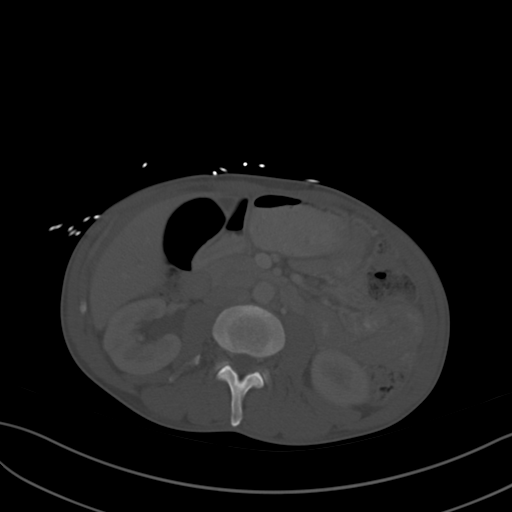
[im 55/76  soft-tissue]
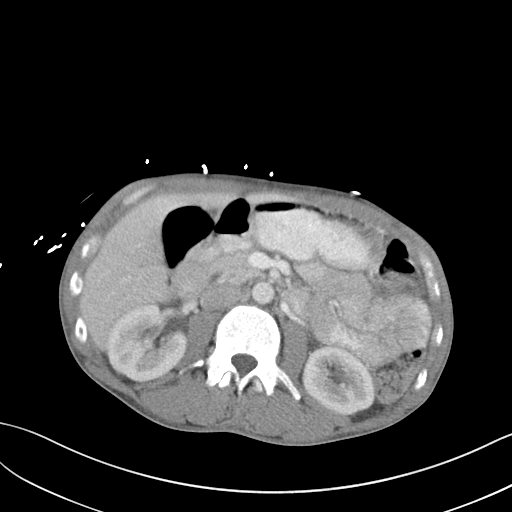
[im 59/76  soft-tissue]
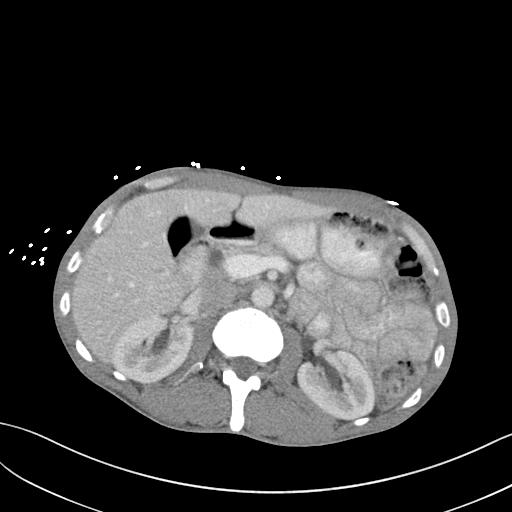
[im 67/76  soft-tissue]
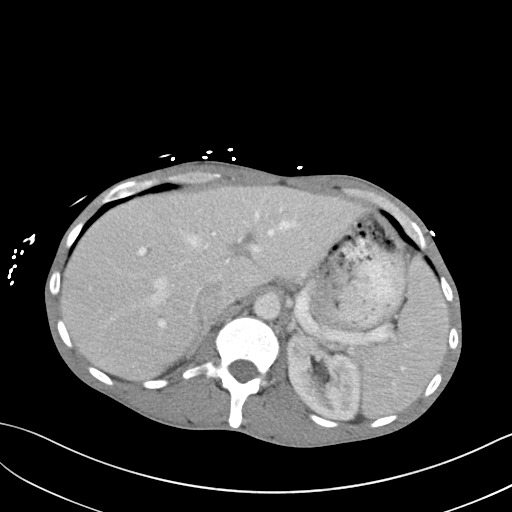
[im 71/76  soft-tissue]
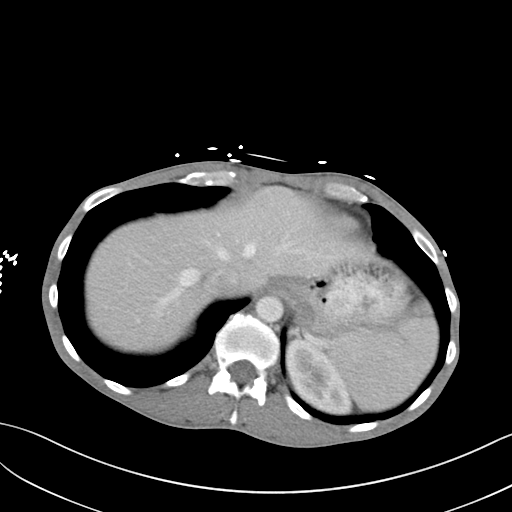

[Series 3: coronal a/|p · coronal · 0.70mm/px · 3 of 69 slices shown]
[im 23/69  soft-tissue]
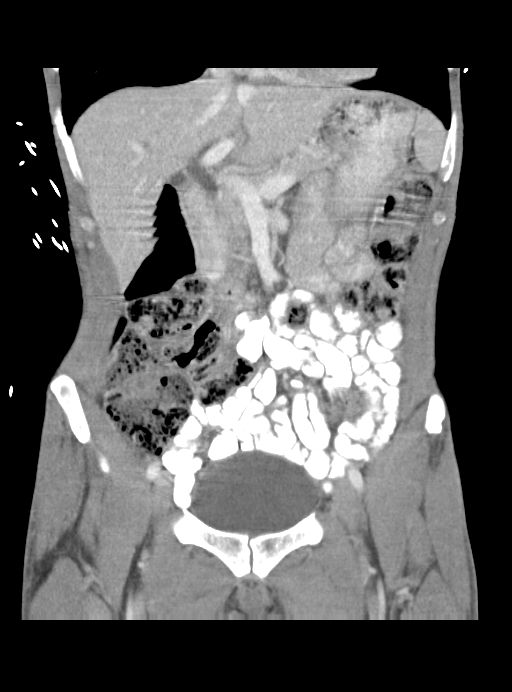
[im 31/69  soft-tissue]
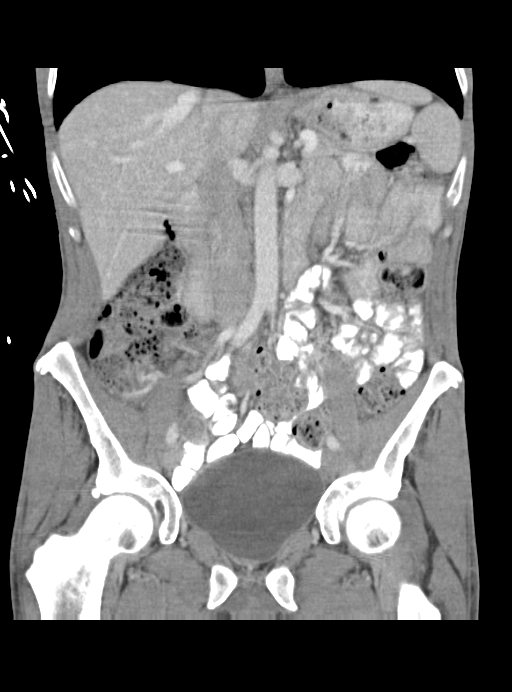
[im 38/69  soft-tissue]
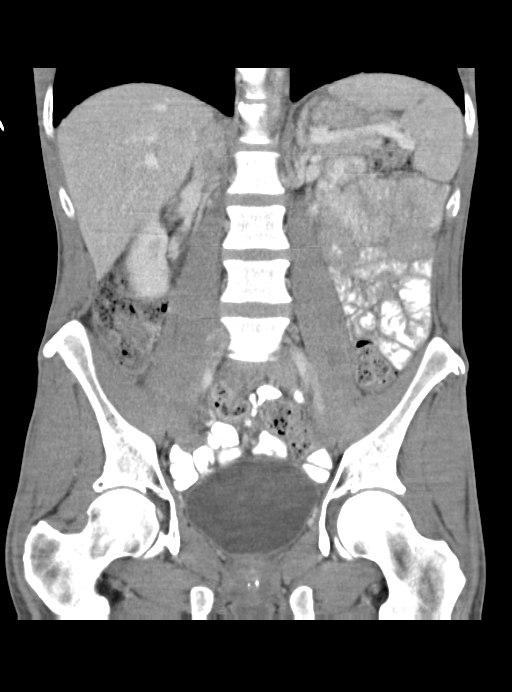

[16 of 46 positions shown; findings below may reference images not displayed]

FINDINGS: Lung bases are free of acute infiltrate or sizable effusion.

The liver, spleen, adrenal glands and pancreas are within normal
limits. The gallbladder is partially distended and within normal
limits. The kidneys are well visualized and demonstrate a normal
enhancement pattern. Normal excretion of contrast is noted
bilaterally. No obstructive changes are seen.

The appendix is not well visualized. The terminal ileum is fluid
filled. No obstructive changes are seen. The bladder is well
distended. No free pelvic fluid is noted. No bony abnormality is
noted.
IMPRESSION: No acute abnormality is seen.

## 2018-07-28 ENCOUNTER — Ambulatory Visit (HOSPITAL_COMMUNITY): Payer: Self-pay

## 2018-07-28 NOTE — Telephone Encounter (Signed)
Case manager called patient to schedule an intake appointment but patient did not answer.  Case manager unable to leave message so sent letter asking patient to call in to schedule an appointment.  07/28/2018  Clyde Upshaw Deonna Carreen Milius, CASE MANAGER

## 2018-07-28 NOTE — Telephone Encounter (Signed)
-----   Message from [place] Nickel Reigel sent at 07/28/2018 10:56 AM EDT -----  ----- Message from [place] Nickel Reigel sent at 07/28/2018 10:16 AM EDT -----    Pt states they are wanting to be seen for psychiatry. Pt states he is available any day before noon. Pt is aware of the process. Please call to advise.     Thank you!

## 2020-06-08 LAB — HEMOGLOBIN A1C
Estimated Avg Glucose, External: 119 mg/dL (ref 91–123)
Hemoglobin A1C, External: 5.8 % — ABNORMAL HIGH (ref 4.8–5.6)

## 2021-09-17 ENCOUNTER — Emergency Department: Admit: 2021-09-17 | Payer: PRIVATE HEALTH INSURANCE

## 2021-09-17 ENCOUNTER — Inpatient Hospital Stay
Admit: 2021-09-17 | Discharge: 2021-09-17 | Disposition: A | Discharge status: Home / Self Care | Payer: PRIVATE HEALTH INSURANCE | Attending: Emergency Medicine

## 2021-09-17 DIAGNOSIS — S022XXB Fracture of nasal bones, initial encounter for open fracture: Secondary | ICD-10-CM

## 2021-09-17 MED ORDER — IBUPROFEN 400 MG PO TABS
400 MG | ORAL | Status: AC
Start: 2021-09-17 — End: 2021-09-17
  Administered 2021-09-17: 13:00:00 800 mg via ORAL

## 2021-09-17 MED ORDER — AMOXICILLIN-POT CLAVULANATE 875-125 MG PO TABS
875-125 | ORAL_TABLET | Freq: Two times a day (BID) | ORAL | 0 refills | Status: AC
Start: 2021-09-17 — End: 2021-09-24

## 2021-09-17 MED ORDER — OXYCODONE-ACETAMINOPHEN 5-325 MG PO TABS
5-325 MG | ORAL_TABLET | Freq: Three times a day (TID) | ORAL | 0 refills | Status: AC | PRN
Start: 2021-09-17 — End: 2021-09-21

## 2021-09-17 MED ORDER — NALOXONE HCL 4 MG/0.1ML NA LIQD
4 | NASAL | 0 refills | Status: DC | PRN
Start: 2021-09-17 — End: 2021-09-26

## 2021-09-17 MED ORDER — OXYCODONE-ACETAMINOPHEN 5-325 MG PO TABS
5-325 MG | ORAL | Status: AC
Start: 2021-09-17 — End: 2021-09-17
  Administered 2021-09-17: 13:00:00 1 via ORAL

## 2021-09-17 MED ORDER — OXYMETAZOLINE HCL 0.05 % NA SOLN
0.05 % | NASAL | Status: AC
Start: 2021-09-17 — End: 2021-09-17
  Administered 2021-09-17: 14:00:00 3 via NASAL

## 2021-09-17 MED ORDER — HYDROCODONE-ACETAMINOPHEN 5-325 MG PO TABS
5-325 MG | ORAL_TABLET | Freq: Four times a day (QID) | ORAL | 0 refills | Status: DC | PRN
Start: 2021-09-17 — End: 2021-09-17

## 2021-09-17 MED ORDER — IBUPROFEN 800 MG PO TABS
800 MG | ORAL_TABLET | Freq: Three times a day (TID) | ORAL | 0 refills | Status: AC | PRN
Start: 2021-09-17 — End: 2021-09-27

## 2021-09-17 MED FILL — IBUPROFEN 400 MG PO TABS: 400 MG | ORAL | Qty: 2

## 2021-09-17 MED FILL — OXYCODONE-ACETAMINOPHEN 5-325 MG PO TABS: 5-325 MG | ORAL | Qty: 1

## 2021-09-17 MED FILL — 12 HOUR NASAL DECONGESTANT 0.05 % NA SOLN: 0.05 % | NASAL | Qty: 30

## 2021-09-17 NOTE — ED Notes (Signed)
Patient requesting [employer] at discharge; refuses to call for ride home.  Patient refuses to listen to discharge instructions, instead interrupting consistently to talk about suboxone, which he is informed repeatedly that he has not been prescribed.  Patient provided with written discharge instructions and [employer].       Vonda Antigua, RN  09/17/21 1042
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
## 2021-09-17 NOTE — Discharge Instructions (Addendum)
Use ice packs on your nose to decrease the swelling and help some with pain.    Sleep at 30 degrees in your bed or in a recliner at all times until further instruction by the ENT specialist.    Use the Afrin spray (2 sprays in each nostril 3 times daily for the next 2-3 days as needed).      Do not blow your nose.    Do not drive, use alcohol or other medication that cause drowsiness while taking Percocet.    Take prescriptions as prescribed.

## 2021-09-25 ENCOUNTER — Inpatient Hospital Stay: Admit: 2021-09-25 | Payer: PRIVATE HEALTH INSURANCE

## 2021-09-25 ENCOUNTER — Encounter

## 2021-09-25 DIAGNOSIS — J342 Deviated nasal septum: Secondary | ICD-10-CM

## 2021-09-25 LAB — CBC WITH AUTO DIFFERENTIAL
Absolute Immature Granulocyte: 0 10*3/uL (ref 0.00–0.04)
Basophils %: 0 % (ref 0–2)
Basophils Absolute: 0 10*3/uL (ref 0.0–0.1)
Eosinophils %: 0 % (ref 0–5)
Eosinophils Absolute: 0 10*3/uL (ref 0.0–0.4)
Hematocrit: 42.5 % (ref 36.0–48.0)
Hemoglobin: 13.9 g/dL (ref 13.0–16.0)
Immature Granulocytes: 0 % (ref 0.0–0.5)
Lymphocytes %: 14 % — ABNORMAL LOW (ref 21–52)
Lymphocytes Absolute: 1.1 10*3/uL (ref 0.9–3.6)
MCH: 31 PG (ref 24.0–34.0)
MCHC: 32.7 g/dL (ref 31.0–37.0)
MCV: 94.9 FL (ref 78.0–100.0)
MPV: 11.2 FL (ref 9.2–11.8)
Monocytes %: 8 % (ref 3–10)
Monocytes Absolute: 0.6 10*3/uL (ref 0.05–1.2)
Neutrophils %: 77 % — ABNORMAL HIGH (ref 40–73)
Neutrophils Absolute: 5.8 10*3/uL (ref 1.8–8.0)
Nucleated RBCs: 0 PER 100 WBC
Platelets: 212 10*3/uL (ref 135–420)
RBC: 4.48 M/uL (ref 4.35–5.65)
RDW: 12.6 % (ref 11.6–14.5)
WBC: 7.6 10*3/uL (ref 4.6–13.2)
nRBC: 0 10*3/uL (ref 0.00–0.01)

## 2021-09-25 LAB — BASIC METABOLIC PANEL
Anion Gap: 3 mmol/L (ref 3.0–18)
BUN: 13 MG/DL (ref 7.0–18)
Bun/Cre Ratio: 12 (ref 12–20)
CO2: 34 mmol/L — ABNORMAL HIGH (ref 21–32)
Calcium: 9.3 MG/DL (ref 8.5–10.1)
Chloride: 103 mmol/L (ref 100–111)
Creatinine: 1.05 MG/DL (ref 0.6–1.3)
Est, Glom Filt Rate: >60 mL/min/{1.73_m2} (ref >60)
Glucose: 69 mg/dL — ABNORMAL LOW (ref 74–99)
Potassium: 4 mmol/L (ref 3.5–5.5)
Sodium: 140 mmol/L (ref 136–145)

## 2021-09-26 ENCOUNTER — Inpatient Hospital Stay: Payer: Medicaid (Managed Care) | Attending: Otolaryngology

## 2021-09-26 LAB — URINE DRUG SCREEN
Amphetamine, Urine: POSITIVE — AB
Barbiturates, Urine: NEGATIVE
Benzodiazepines, Urine: NEGATIVE
Cocaine, Urine: NEGATIVE
Methadone, Urine: NEGATIVE
Opiates, Urine: NEGATIVE
PCP, Urine: NEGATIVE
THC, TH-Cannabinol, Urine: POSITIVE — AB

## 2021-09-26 MED ORDER — ONDANSETRON HCL 4 MG/2ML IJ SOLN
4 MG/2ML | Freq: Once | INTRAMUSCULAR | Status: DC | PRN
Start: 2021-09-26 — End: 2021-09-26

## 2021-09-26 MED ORDER — LIDOCAINE HCL (PF) 2 % IJ SOLN
2 % | INTRAMUSCULAR | Status: DC | PRN
Start: 2021-09-26 — End: 2021-09-26
  Administered 2021-09-26: 22:00:00 100 via INTRAVENOUS

## 2021-09-26 MED ORDER — ONDANSETRON HCL 4 MG/2ML IJ SOLN
4 MG/2ML | INTRAMUSCULAR | Status: DC | PRN
Start: 2021-09-26 — End: 2021-09-26
  Administered 2021-09-26: 22:00:00 4 via INTRAVENOUS

## 2021-09-26 MED ORDER — LACTATED RINGERS IV SOLN
INTRAVENOUS | Status: DC
Start: 2021-09-26 — End: 2021-09-26
  Administered 2021-09-26: 21:00:00 via INTRAVENOUS

## 2021-09-26 MED ORDER — ROCURONIUM BROMIDE 50 MG/5ML IV SOLN
50 MG/5ML | INTRAVENOUS | Status: AC
Start: 2021-09-26 — End: ?

## 2021-09-26 MED ORDER — HYDROCODONE-ACETAMINOPHEN 7.5-325 MG PO TABS
7.5-325 MG | ORAL_TABLET | Freq: Four times a day (QID) | ORAL | 0 refills | Status: AC | PRN
Start: 2021-09-26 — End: 2021-10-03

## 2021-09-26 MED ORDER — SUCCINYLCHOLINE CHLORIDE 100 MG/5ML IV SOSY
100 MG/5ML | INTRAVENOUS | Status: AC
Start: 2021-09-26 — End: ?

## 2021-09-26 MED ORDER — MIDAZOLAM HCL 2 MG/2ML IJ SOLN
2 MG/ML | INTRAMUSCULAR | Status: AC
Start: 2021-09-26 — End: ?

## 2021-09-26 MED ORDER — FENTANYL CITRATE (PF) 100 MCG/2ML IJ SOLN
100 MCG/2ML | INTRAMUSCULAR | Status: DC | PRN
Start: 2021-09-26 — End: 2021-09-26

## 2021-09-26 MED ORDER — OXYCODONE HCL 5 MG PO TABS
5 MG | ORAL | Status: AC | PRN
Start: 2021-09-26 — End: 2021-09-26

## 2021-09-26 MED ORDER — DEXMEDETOMIDINE HCL 200 MCG/2ML IV SOLN
200 MCG/2ML | INTRAVENOUS | Status: DC | PRN
Start: 2021-09-26 — End: 2021-09-26
  Administered 2021-09-26 (×2): 4 via INTRAVENOUS

## 2021-09-26 MED ORDER — MIDAZOLAM HCL 2 MG/2ML IJ SOLN
2 MG/ML | INTRAMUSCULAR | Status: DC | PRN
Start: 2021-09-26 — End: 2021-09-26
  Administered 2021-09-26: 22:00:00 2 via INTRAVENOUS

## 2021-09-26 MED ORDER — DEXMEDETOMIDINE HCL 200 MCG/2ML IV SOLN
200 MCG/2ML | INTRAVENOUS | Status: AC
Start: 2021-09-26 — End: ?

## 2021-09-26 MED ORDER — PROPOFOL 200 MG/20ML IV EMUL
200 MG/20ML | INTRAVENOUS | Status: DC | PRN
Start: 2021-09-26 — End: 2021-09-26
  Administered 2021-09-26: 22:00:00 200 via INTRAVENOUS

## 2021-09-26 MED ORDER — PROPOFOL 200 MG/20ML IV EMUL
200 MG/20ML | INTRAVENOUS | Status: AC
Start: 2021-09-26 — End: ?

## 2021-09-26 MED ORDER — DEXAMETHASONE SODIUM PHOSPHATE 4 MG/ML IJ SOLN
4 MG/ML | INTRAMUSCULAR | Status: AC
Start: 2021-09-26 — End: ?

## 2021-09-26 MED ORDER — HYDROMORPHONE HCL PF 1 MG/ML IJ SOLN
1 MG/ML | INTRAMUSCULAR | Status: DC | PRN
Start: 2021-09-26 — End: 2021-09-26

## 2021-09-26 MED ORDER — FENTANYL CITRATE (PF) 100 MCG/2ML IJ SOLN
100 MCG/2ML | INTRAMUSCULAR | Status: AC
Start: 2021-09-26 — End: ?

## 2021-09-26 MED ORDER — CEPHALEXIN 500 MG PO CAPS
500 MG | ORAL_CAPSULE | Freq: Three times a day (TID) | ORAL | 0 refills | Status: AC
Start: 2021-09-26 — End: 2021-10-06

## 2021-09-26 MED ORDER — KETAMINE HCL 50 MG/5ML IJ SOSY
50 MG/5ML | INTRAMUSCULAR | Status: AC
Start: 2021-09-26 — End: ?

## 2021-09-26 MED ORDER — LIDOCAINE HCL (PF) 2 % IJ SOLN
2 % | INTRAMUSCULAR | Status: AC
Start: 2021-09-26 — End: ?

## 2021-09-26 MED ORDER — BACITRACIN 500 UNIT/GM EX OINT
500 UNIT/GM | CUTANEOUS | Status: DC | PRN
Start: 2021-09-26 — End: 2021-09-26
  Administered 2021-09-26: 22:00:00 1 via TOPICAL

## 2021-09-26 MED ORDER — SUCCINYLCHOLINE CHLORIDE 100 MG/5ML IV SOSY
100 MG/5ML | INTRAVENOUS | Status: DC | PRN
Start: 2021-09-26 — End: 2021-09-26
  Administered 2021-09-26: 22:00:00 100 via INTRAVENOUS

## 2021-09-26 MED ORDER — BACITRACIN 500 UNIT/GM EX OINT
500 UNIT/GM | CUTANEOUS | Status: AC
Start: 2021-09-26 — End: ?

## 2021-09-26 MED ORDER — OXYMETAZOLINE HCL 0.05 % NA SOLN
0.05 % | NASAL | Status: DC | PRN
Start: 2021-09-26 — End: 2021-09-26
  Administered 2021-09-26: 22:00:00 1 via TOPICAL

## 2021-09-26 MED ORDER — OXYCODONE HCL 5 MG PO TABS
5 MG | ORAL | Status: AC | PRN
Start: 2021-09-26 — End: 2021-09-26
  Administered 2021-09-26: 23:00:00 5 mg via ORAL

## 2021-09-26 MED ORDER — FENTANYL CITRATE (PF) 100 MCG/2ML IJ SOLN
100 MCG/2ML | INTRAMUSCULAR | Status: DC | PRN
Start: 2021-09-26 — End: 2021-09-26
  Administered 2021-09-26: 22:00:00 100 via INTRAVENOUS

## 2021-09-26 MED ORDER — OXYMETAZOLINE HCL 0.05 % NA SOLN
0.05 % | NASAL | Status: AC
Start: 2021-09-26 — End: ?

## 2021-09-26 MED ORDER — ONDANSETRON HCL 4 MG/2ML IJ SOLN
4 MG/2ML | INTRAMUSCULAR | Status: AC
Start: 2021-09-26 — End: ?

## 2021-09-26 MED FILL — DEXAMETHASONE SODIUM PHOSPHATE 4 MG/ML IJ SOLN: 4 MG/ML | INTRAMUSCULAR | Qty: 1

## 2021-09-26 MED FILL — DEXMEDETOMIDINE HCL 200 MCG/2ML IV SOLN: 200 MCG/2ML | INTRAVENOUS | Qty: 2

## 2021-09-26 MED FILL — XYLOCAINE-MPF 2 % IJ SOLN: 2 % | INTRAMUSCULAR | Qty: 5

## 2021-09-26 MED FILL — OXYCODONE HCL 5 MG PO TABS: 5 MG | ORAL | Qty: 1

## 2021-09-26 MED FILL — LACTATED RINGERS IV SOLN: INTRAVENOUS | Qty: 1000

## 2021-09-26 MED FILL — BACITRACIN 500 UNIT/GM EX OINT: 500 UNIT/GM | CUTANEOUS | Qty: 14

## 2021-09-26 MED FILL — DIPRIVAN 200 MG/20ML IV EMUL: 200 MG/20ML | INTRAVENOUS | Qty: 20

## 2021-09-26 MED FILL — 12 HOUR NASAL DECONGESTANT 0.05 % NA SOLN: 0.05 % | NASAL | Qty: 30

## 2021-09-26 MED FILL — SUCCINYLCHOLINE CHLORIDE 100 MG/5ML IV SOSY: 100 MG/5ML | INTRAVENOUS | Qty: 5

## 2021-09-26 MED FILL — FENTANYL CITRATE (PF) 100 MCG/2ML IJ SOLN: 100 MCG/2ML | INTRAMUSCULAR | Qty: 2

## 2021-09-26 MED FILL — KETAMINE HCL 50 MG/5ML IJ SOSY: 50 MG/5ML | INTRAMUSCULAR | Qty: 5

## 2021-09-26 MED FILL — MIDAZOLAM HCL 2 MG/2ML IJ SOLN: 2 MG/ML | INTRAMUSCULAR | Qty: 2

## 2021-09-26 MED FILL — ONDANSETRON HCL 4 MG/2ML IJ SOLN: 4 MG/2ML | INTRAMUSCULAR | Qty: 2

## 2021-09-26 MED FILL — ROCURONIUM BROMIDE 50 MG/5ML IV SOLN: 50 MG/5ML | INTRAVENOUS | Qty: 5

## 2021-09-26 NOTE — Progress Notes (Signed)
OTO-HNS   Pt seen and examined   H&P reviewed  No new changes   Kyle Todd
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
## 2021-09-26 NOTE — Discharge Instructions (Addendum)
Facial Trauma Repair: What to Expect at Home  Your Recovery  Facial trauma repair is surgery to fix an injury to the face or jaw. The surgery may have been done to stop bleeding, repair damaged tissue, or fix broken bones.  Your face may be swollen and bruised. It may take 5 to 7 days for the swelling to go down, and 10 to 14 days for the bruising to fade. It may be hard to eat at first.  If you have stitches, the doctor may need to remove them about a week after surgery.  It will probably take a few months for you to heal after surgery. Your face may look different than it did before your injury. Sometimes more surgery is needed later to help make your face look as close to how it did before the injury as possible.  When you can return to work depends on the injury you had and what type of work you do. You may be able to go back to work in 1 to 2 weeks.  This care sheet gives you a general idea about how long it will take for you to recover. But each person recovers at a different pace. Follow the steps below to get better as quickly as possible.  How can you care for yourself at home?  Activity    Rest when you feel tired. Getting enough sleep will help you recover. Sleep with your head up by using two or three pillows. You can also try to sleep with your head up in a reclining chair.     Avoid any activity that might re-injure your face or jaw, until your doctor says it is okay.     Follow your doctor's instructions for cleaning your teeth and mouth.     Ask your doctor when you can drive again.     You will probably need to take at least 1 to 2 weeks off from work. But you may need to take longer off work, depending on your injury and the type of work you do.   Diet    Follow your doctor's advice about what you can eat. You may need to eat a soft diet, or you may have to drink your meals through a straw.     Drink plenty of fluids to avoid becoming dehydrated.   Medicines    Your doctor will tell you if and  when you can restart your medicines. He or she will also give you instructions about taking any new medicines.     If you stopped taking aspirin or some other blood thinner, your doctor will tell you when to start taking it again.     Be safe with medicines. Take pain medicines exactly as directed.  If the doctor gave you a prescription medicine for pain, take it as prescribed.  If you are not taking a prescription pain medicine, ask your doctor if you can take an over-the-counter medicine.     If you think your pain medicine is making you sick to your stomach:  Take your pain medicine after meals (unless your doctor has told you not to).  Ask your doctor for a different pain medicine.     If your doctor prescribed antibiotics, take them as directed. Do not stop taking them just because you feel better. You need to take the full course of antibiotics.   Incision care    If you have an incision, or cuts or scrapes on your face,  wash the area daily with warm, soapy water, and pat it dry.     Your doctor may give you other instructions about how to care for your incision. Follow your doctor's instructions exactly.   Ice    Put ice or a cold pack on your face or jaw for 10 to 20 minutes at a time. Try to do this 3 or more times a day for 10 to 20 minutes at a time. Put a thin cloth between the ice and your skin.   Other instructions    If your jaw is wired shut, keep wire cutters with you at all times in case you throw up. Your doctor will show you how to use them.   Follow-up care is a key part of your treatment and safety. Be sure to make and go to all appointments, and call your doctor if you are having problems. It's also a good idea to know your test results and keep a list of the medicines you take.  When should you call for help?   Call 911 anytime you think you may need emergency care. For example, call if:    You passed out (lost consciousness).     You have severe trouble breathing.     You have sudden chest  pain and shortness of breath, or you cough up blood.   Call your doctor now or seek immediate medical care if:    You have pain that does not get better after you take pain medicine.     You have loose stitches, or your incision comes open.     You are bleeding from the incision.     You have signs of infection, such as:  Increased pain, swelling, warmth, or redness.  Red streaks leading from the incision.  Pus draining from the incision.  A fever.     You have signs of a blood clot in your leg, such as:  Pain in your calf, back of the knee, thigh, or groin.  Redness and swelling in your leg or groin.     You have trouble talking or swallowing.     Your mouth is bleeding.   Watch closely for any changes in your health, and be sure to contact your doctor if:    You do not get better as expected.   Where can you learn more?  Go to [employer] and enter W614 to learn more about "Facial Trauma Repair: What to Expect at Home."  Current as of: August 29, 2020               Content Version: 13.6   2006-2023 [employer].   Care instructions adapted under license by [facility]. If you have questions about a medical condition or this instruction, always ask your healthcare professional. [employer] disclaims any warranty or liability for your use of this information.      DISCHARGE SUMMARY from Nurse    PATIENT INSTRUCTIONS:    After general anesthesia or intravenous sedation, for 24 hours or while taking prescription Narcotics:  Limit your activities  Do not drive and operate hazardous machinery  Do not make important personal or business decisions  Do  not drink alcoholic beverages  If you have not urinated within 8 hours after discharge, please contact your surgeon on call.    Report the following to your surgeon:  Excessive pain, swelling, redness or odor of or around the surgical area  Temperature over 100.5  Nausea and vomiting  lasting longer than 4 hours or if unable  to take medications  Any signs of decreased circulation or nerve impairment to extremity: change in color, persistent  numbness, tingling, coldness or increase pain  Any questions    What to do at Home:  Recommended activity: activity as tolerated    *  Please give a list of your current medications to your Primary Care Provider.    *  Please update this list whenever your medications are discontinued, doses are      changed, or new medications (including over-the-counter products) are added.    *  Please carry medication information at all times in case of emergency situations.    These are general instructions for a healthy lifestyle:    No smoking/ No tobacco products/ Avoid exposure to second hand smoke  Surgeon General's Warning:  Quitting smoking now greatly reduces serious risk to your health.    Obesity, smoking, and sedentary lifestyle greatly increases your risk for illness    A healthy diet, regular physical exercise & weight monitoring are important for maintaining a healthy lifestyle    You may be retaining fluid if you have a history of heart failure or if you experience any of the following symptoms:  Weight gain of 3 pounds or more overnight or 5 pounds in a week, increased swelling in our hands or feet or shortness of breath while lying flat in bed.  Please call your doctor as soon as you notice any of these symptoms; do not wait until your next office visit.        The discharge information has been reviewed with the spouse.  The spouse verbalized understanding.  Discharge medications reviewed with the spouse and appropriate educational materials and side effects teaching were provided.    Patient armband removed and shredded

## 2021-09-26 NOTE — Other (Signed)
Family updated on patient status.

## 2021-09-26 NOTE — Anesthesia Pre-Procedure Evaluation (Signed)
Department of Anesthesiology  Preprocedure Note       Name:  Kyle Todd   Age:  51 y.o.  DOB:  [birth year]                                          MRN:  [id number]         Date:  09/26/2021      Surgeon: Moishe Spice):  Asencion Partridge, MD    Procedure: Procedure(s):  CLOSED REDUCTION NASAL FRACTURE, NASAL SEPTAL FRACTURE REDUCTION    Medications prior to admission:   Prior to Admission medications    Medication Sig Start Date End Date Taking? Authorizing Provider   amphetamine-dextroamphetamine (ADDERALL XR) 15 MG extended release capsule Take 6 capsules by mouth every morning. Max Daily Amount: 90 mg   Yes Historical Provider, MD   clonazePAM (KLONOPIN) 2 MG tablet Take 1 tablet by mouth 3 times daily. 08/25/21   Historical Provider, MD   gabapentin (NEURONTIN) 600 MG tablet Take 1 tablet by mouth 4 times daily. 09/25/21   Historical Provider, MD   naloxone Jonelle Sports) 4 MG/0.1ML LIQD nasal spray 1 spray by Nasal route as needed for Opioid Reversal (for opioid overdose)  Patient not taking: Reported on 09/26/2021 09/17/21   Ewing Schlein, DO   ibuprofen (ADVIL;MOTRIN) 800 MG tablet Take 1 tablet by mouth every 8 hours as needed for Pain Take with food 09/17/21 09/27/21  Ewing Schlein, DO       Current medications:    Current Facility-Administered Medications   Medication Dose Route Frequency Provider Last Rate Last Admin   . lactated ringers IV soln infusion   IntraVENous Continuous Asencion Partridge, MD 100 mL/hr at 09/26/21 1741 NoRateChange at 09/26/21 1741       Allergies:  No Known Allergies    Problem List:  There is no problem list on file for this patient.      Past Medical History:        Diagnosis Date   . ADHD    . Anxiety    . Depression    . Dyslexia        Past Surgical History:  History reviewed. No pertinent surgical history.    Social History:    Social History     Tobacco Use   . Smoking status: Never   . Smokeless tobacco: Never   Substance Use Topics   . Alcohol use: Yes     Alcohol/week: 5.0 standard drinks      Types: 5 Drinks containing 0.5 oz of alcohol per week                                Counseling given: Not Answered      Vital Signs (Current):   Vitals:    09/26/21 1621   BP: 120/80   Pulse: 51   Resp: 16   Temp: 97.5 F (36.4 C)   TempSrc: Oral   SpO2: 100%   Weight: 143 lb 3.2 oz (65 kg)   Height: 5\' 11"  (1.803 m)                                              BP Readings  from Last 3 Encounters:   09/26/21 120/80   09/17/21 119/73       NPO Status: Time of last liquid consumption:  (pt npo)                        Time of last solid consumption: 2330                        Date of last liquid consumption: 09/25/21                        Date of last solid food consumption: 09/25/21    BMI:   Wt Readings from Last 3 Encounters:   09/26/21 143 lb 3.2 oz (65 kg)   09/17/21 155 lb (70.3 kg)     Body mass index is 19.97 kg/m.    CBC:   Lab Results   Component Value Date/Time    WBC 7.6 09/25/2021 06:41 PM    RBC 4.48 09/25/2021 06:41 PM    HGB 13.9 09/25/2021 06:41 PM    HCT 42.5 09/25/2021 06:41 PM    MCV 94.9 09/25/2021 06:41 PM    RDW 12.6 09/25/2021 06:41 PM    PLT 212 09/25/2021 06:41 PM       CMP:   Lab Results   Component Value Date/Time    NA 140 09/25/2021 06:37 PM    K 4.0 09/25/2021 06:37 PM    CL 103 09/25/2021 06:37 PM    CO2 34 09/25/2021 06:37 PM    BUN 13 09/25/2021 06:37 PM    CREATININE 1.05 09/25/2021 06:37 PM    LABGLOM >60 09/25/2021 06:37 PM    GLUCOSE 69 09/25/2021 06:37 PM    CALCIUM 9.3 09/25/2021 06:37 PM       POC Tests: No results for input(s): POCGLU, POCNA, POCK, POCCL, POCBUN, POCHEMO, POCHCT in the last 72 hours.    Coags: No results found for: PROTIME, INR, APTT    HCG (If Applicable): No results found for: PREGTESTUR, PREGSERUM, HCG, HCGQUANT     ABGs: No results found for: PHART, PO2ART, PCO2ART, HCO3ART, BEART, O2SATART     Type & Screen (If Applicable):  No results found for: LABABO, LABRH    Drug/Infectious Status (If Applicable):  No results found for: HIV, HEPCAB    COVID-19  Screening (If Applicable): No results found for: COVID19        Anesthesia Evaluation  Patient summary reviewed and Nursing notes reviewed no history of anesthetic complications:   Airway: Mallampati: II  TM distance: >3 FB   Neck ROM: full  Mouth opening: > = 3 FB   Dental: normal exam         Pulmonary:normal exam    (+) sleep apnea: on noncompliant,  current smoker    (-) COPD and asthma          Patient did not smoke on day of surgery.                ROS comment: H/o tobacco and marijuana use - none on DOS   Cardiovascular:Negative CV ROS  Exercise tolerance: good (>4 METS),                     Neuro/Psych:   (+) psychiatric history:   (-) seizures, neuromuscular disease, TIA and CVA           GI/Hepatic/Renal: Neg GI/Hepatic/Renal ROS  Endo/Other: Negative Endo/Other ROS                    Abdominal:             Vascular:          Other Findings:           Anesthesia Plan      general     ASA 2       Induction: intravenous.      Anesthetic plan and risks discussed with patient.      Plan discussed with CRNA.                    Hulan Amato Miia Blanks, DO   09/26/2021

## 2021-09-26 NOTE — Other (Signed)
Anesthesia paged for sign out.

## 2021-09-26 NOTE — Other (Signed)
SPOKE WITH DR. Baird Lyons IN REGARDS TO PATIENTS HISTORY OF DRUG USE. URINE DRUG SCREEN ORDERED.

## 2021-09-26 NOTE — Other (Signed)
Reviewed PTA medication list with patient/caregiver and patient/caregiver denies any additional medications.     Patient admits to having a responsible adult care for them at home for at least 24 hours after surgery.    Patient encouraged to use gown warming system and informed that using said warming gown to regulate body temperature prior to a procedure has been shown to help reduce the risks of blood clots and infection.    Patient's pharmacy of choice verified and documented in PTA medication section.

## 2021-09-26 NOTE — Other (Signed)
Discharge instructions reviewed with family member. No questions or concerns at this time.

## 2021-09-26 NOTE — Op Note (Unsigned)
[facility]  OPERATIVE REPORT    Name:  Kyle Todd, Kyle Todd  MR#:   [id number]  DOB:  [birth year]  ACCOUNT #:  [id number]  DATE OF SERVICE:  09/26/2021    PREOPERATIVE DIAGNOSES:  1.  Nasal fracture.  2.  Nasal septal fracture.    POSTOPERATIVE DIAGNOSES:  1.  Nasal fracture.  2.  Nasal septal fracture.    PROCEDURES PERFORMED:  1.  Closed reduction, nasal fracture.  2.  Closed reduction, nasal septal fracture.    SURGEON:  Asencion Partridge, MD    ASSISTANT:  None.    ANESTHESIA:  General endotracheal anesthesia.    COMPLICATIONS:  None.    SPECIMENS REMOVED:  None.    IMPLANTS:  None.    ESTIMATED BLOOD LOSS:  Less than 5 mL.    DRAINS:  None.    OPERATIVE FINDINGS:  1.  Significant deviation of the nasal septum.  2.  The patient's left side, there was some bare cartilage which was trimmed as well.  3.  Significant concave deformity of the patient's nasal bone on the left side.  Right side reveals a convex deformity, both of which were straightened.    PROCEDURE:  The patient was brought to the operating room, placed supine on the operating room table.  General endotracheal anesthesia was given per Anesthesiology Department.  Once the patient was sufficiently anesthetized, topically applied Neo-Synephrine-soaked pledgets were placed intranasally.  These were then left in place to allow for optimal vasoconstriction.  While we were awaiting the vasoconstriction, the external nasal bones were corrected.  This was corrected with the use of digital pressure on the convex deformity on the right side with use of Boies elevator on the left side.  This allowed for elevation of the nasal bone.  It should be noted that the nasal bones themselves were noted to be significantly fractured.  This did appear to be comminuted in nature.  In any event, the concave deformity on the left side was straightened as well as the convex deformity on the right side.  There still was a small deviation on the left outside; however, I  suspected the patient probably has had multiple nasal fractures in the past.  In any event, this was kept intact and once this was completed, the pledgets were removed from the intranasal vault.  The septum was noted to be deviated.  This was deviated back to the right side.  There was a small amount of the cartilage which was noted to be free floating outside the mucosa.  This was trimmed to prevent any potential infection.  No difficulties were encountered.  Bleeding then was controlled with topically applied Neo-Synephrine-soaked pledgets.  Once the above was completed, a Denver splint was placed on the external nose.  Then, Doyle splints were then placed intranasally.  The Denver splint was anchored with Steri-Strips.  The Doyle splints were anchored with 3-0 silk suture.  No difficulties were encountered.  The patient tolerated this well.  The patient was subsequently taken from the operating room to the recovery room in stable condition after correct needle and sponge count were obtained.      Asencion Partridge, MD      [id number]  D:  09/26/2021 18:39  T:  09/27/2021 2:08  JOB #:  [id number]

## 2021-09-26 NOTE — Other (Signed)
Bedside report received from Sherian Maroon, RN

## 2021-09-26 NOTE — Other (Signed)
Patient tolerating PO fluids at this time.

## 2021-09-26 NOTE — Brief Op Note (Signed)
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]     * Closed fracture of nasal bone, initial encounter [S02.2XXA]     * Dislocation of septal cartilage of nose, initial encounter [S03.1XXA]    Post-Op Diagnosis: Same       Procedure(s):  CLOSED REDUCTION NASAL FRACTURE, NASAL SEPTAL FRACTURE REDUCTION    Surgeon(s):  Asencion Partridge, MD    Assistant:  Surgical Assistant: Antony Salmon    Anesthesia: General    Estimated Blood Loss (mL): Minimal    Complications: None    Specimens:   * No specimens in log *    Implants:  * No implants in log *      Drains: * No LDAs found *    Findings: DNS.nasal fracture      Electronically signed by Asencion Partridge, MD on 12-Oct-2021 at 6:31 PM

## 2021-09-26 NOTE — Anesthesia Post-Procedure Evaluation (Signed)
Post-Anesthesia Evaluation and Assessment    Cardiovascular Function/Vital Signs  Visit Vitals  BP 128/83   Pulse 77   Temp 97.3 F (36.3 C) (Temporal)   Resp 13   Ht 5\' 11"  (1.803 m)   Wt 143 lb 3.2 oz (65 kg)   SpO2 100%   BMI 19.97 kg/m       Patient is status post Procedure(s):  CLOSED REDUCTION NASAL FRACTURE, NASAL SEPTAL FRACTURE REDUCTION.    Nausea/Vomiting: Controlled.    Postoperative hydration reviewed and adequate.    Pain:      Managed.    Neurological Status:       At baseline.    Mental Status and Level of Consciousness: Progressing to baseline appropriately     Pulmonary Status:       Adequate oxygenation and airway patent.    Complications related to anesthesia: None    Post-anesthesia assessment completed. No concerns.        Signed By: , MD    Sep 26, 2021
